# Patient Record
Sex: Male | Born: 1990 | Race: White | Hispanic: No | Marital: Married | Smoking: Never smoker
Health system: Southern US, Community
[De-identification: ages and names within clinical notes are randomized; demographics above are authoritative.]

## PROBLEM LIST (undated history)

## (undated) DIAGNOSIS — J45909 Unspecified asthma, uncomplicated: Secondary | ICD-10-CM

## (undated) DIAGNOSIS — M419 Scoliosis, unspecified: Secondary | ICD-10-CM

## (undated) DIAGNOSIS — N200 Calculus of kidney: Secondary | ICD-10-CM

## (undated) DIAGNOSIS — F419 Anxiety disorder, unspecified: Secondary | ICD-10-CM

## (undated) HISTORY — DX: Calculus of kidney: N20.0

## (undated) HISTORY — PX: KNEE ARTHROSCOPY: SHX127

## (undated) HISTORY — DX: Anxiety disorder, unspecified: F41.9

## (undated) HISTORY — DX: Unspecified asthma, uncomplicated: J45.909

## (undated) HISTORY — PX: OTHER SURGICAL HISTORY: SHX169

## (undated) HISTORY — DX: Scoliosis, unspecified: M41.9

---

## 2015-04-19 ENCOUNTER — Ambulatory Visit (INDEPENDENT_AMBULATORY_CARE_PROVIDER_SITE_OTHER): Payer: 59 | Admitting: Adult Health

## 2015-04-19 ENCOUNTER — Encounter: Payer: Self-pay | Admitting: Adult Health

## 2015-04-19 VITALS — BP 110/84 | Temp 98.1°F | Ht 71.0 in | Wt 256.2 lb

## 2015-04-19 DIAGNOSIS — M419 Scoliosis, unspecified: Secondary | ICD-10-CM | POA: Diagnosis not present

## 2015-04-19 DIAGNOSIS — Z0000 Encounter for general adult medical examination without abnormal findings: Secondary | ICD-10-CM

## 2015-04-19 DIAGNOSIS — N529 Male erectile dysfunction, unspecified: Secondary | ICD-10-CM | POA: Diagnosis not present

## 2015-04-19 DIAGNOSIS — J4520 Mild intermittent asthma, uncomplicated: Secondary | ICD-10-CM | POA: Diagnosis not present

## 2015-04-19 LAB — POCT URINALYSIS DIPSTICK
Bilirubin, UA: NEGATIVE
GLUCOSE UA: NEGATIVE
Ketones, UA: NEGATIVE
Leukocytes, UA: NEGATIVE
NITRITE UA: NEGATIVE
Protein, UA: NEGATIVE
RBC UA: NEGATIVE
Urobilinogen, UA: 0.2
pH, UA: 6.5

## 2015-04-19 LAB — BASIC METABOLIC PANEL
BUN: 10 mg/dL (ref 6–23)
CHLORIDE: 102 meq/L (ref 96–112)
CO2: 30 meq/L (ref 19–32)
CREATININE: 0.89 mg/dL (ref 0.40–1.50)
Calcium: 9.8 mg/dL (ref 8.4–10.5)
GFR: 112.6 mL/min (ref >60.00)
Glucose, Bld: 103 mg/dL — ABNORMAL HIGH (ref 70–99)
Potassium: 4.2 mEq/L (ref 3.5–5.1)
Sodium: 141 mEq/L (ref 135–145)

## 2015-04-19 LAB — CBC WITH DIFFERENTIAL/PLATELET
BASOS ABS: 0 10*3/uL (ref 0.0–0.1)
Basophils Relative: 0.5 % (ref 0.0–3.0)
EOS ABS: 0.2 10*3/uL (ref 0.0–0.7)
Eosinophils Relative: 2.2 % (ref 0.0–5.0)
HCT: 47.1 % (ref 39.0–52.0)
HEMOGLOBIN: 15.9 g/dL (ref 13.0–17.0)
LYMPHS ABS: 1.9 10*3/uL (ref 0.7–4.0)
Lymphocytes Relative: 28.1 % (ref 12.0–46.0)
MCHC: 33.9 g/dL (ref 30.0–36.0)
MCV: 85.4 fl (ref 78.0–100.0)
Monocytes Absolute: 0.7 10*3/uL (ref 0.1–1.0)
Monocytes Relative: 9.7 % (ref 3.0–12.0)
NEUTROS ABS: 4.1 10*3/uL (ref 1.4–7.7)
NEUTROS PCT: 59.5 % (ref 43.0–77.0)
Platelets: 352 10*3/uL (ref 150.0–400.0)
RBC: 5.51 Mil/uL (ref 4.22–5.81)
RDW: 13.8 % (ref 11.5–15.5)
WBC: 6.8 10*3/uL (ref 4.0–10.5)

## 2015-04-19 LAB — TSH: TSH: 1.13 u[IU]/mL (ref 0.35–4.50)

## 2015-04-19 LAB — LIPID PANEL
CHOL/HDL RATIO: 5
Cholesterol: 147 mg/dL (ref 0–200)
HDL: 30.5 mg/dL — AB (ref >39.00)
LDL Cholesterol: 94 mg/dL (ref 0–99)
NonHDL: 116.5
TRIGLYCERIDES: 111 mg/dL (ref 0.0–149.0)
VLDL: 22.2 mg/dL (ref 0.0–40.0)

## 2015-04-19 LAB — HEPATIC FUNCTION PANEL
ALBUMIN: 4.4 g/dL (ref 3.5–5.2)
ALK PHOS: 78 U/L (ref 39–117)
ALT: 23 U/L (ref 0–53)
AST: 15 U/L (ref 0–37)
BILIRUBIN DIRECT: 0.1 mg/dL (ref 0.0–0.3)
TOTAL PROTEIN: 7.7 g/dL (ref 6.0–8.3)
Total Bilirubin: 0.6 mg/dL (ref 0.2–1.2)

## 2015-04-19 LAB — HEMOGLOBIN A1C: Hgb A1c MFr Bld: 5.5 % (ref 4.6–6.5)

## 2015-04-19 MED ORDER — ALBUTEROL SULFATE 108 (90 BASE) MCG/ACT IN AEPB
108.0000 ug | INHALATION_SPRAY | Freq: Two times a day (BID) | RESPIRATORY_TRACT | Status: DC | PRN
Start: 2015-04-19 — End: 2017-01-22

## 2015-04-19 MED ORDER — MELOXICAM 15 MG PO TABS: 15.0000 mg | ORAL_TABLET | Freq: Every day | ORAL | Status: DC
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
## 2015-04-19 NOTE — Patient Instructions (Addendum)
It was great meeting you today!   I have sent prescriptions in to the pharmacy for Mobic, which you will take for your back pain as well as the Pro Air inhaler.   I will follow up with you regarding your blood work.   Continue to exercise and eat healthy.   Health Maintenance A healthy lifestyle and preventative care can promote health and wellness.  Maintain regular health, dental, and eye exams.  Eat a healthy diet. Foods like vegetables, fruits, whole grains, low-fat dairy products, and lean protein foods contain the nutrients you need and are low in calories. Decrease your intake of foods high in solid fats, added sugars, and salt. Get information about a proper diet from your health care provider, if necessary.  Regular physical exercise is one of the most important things you can do for your health. Most adults should get at least 150 minutes of moderate-intensity exercise (any activity that increases your heart rate and causes you to sweat) each week. In addition, most adults need muscle-strengthening exercises on 2 or more days a week.   Maintain a healthy weight. The body mass index (BMI) is a screening tool to identify possible weight problems. It provides an estimate of body fat based on height and weight. Your health care provider can find your BMI and can help you achieve or maintain a healthy weight. For males 20 years and older:  A BMI below 18.5 is considered underweight.  A BMI of 18.5 to 24.9 is normal.  A BMI of 25 to 29.9 is considered overweight.  A BMI of 30 and above is considered obese.  Maintain normal blood lipids and cholesterol by exercising and minimizing your intake of saturated fat. Eat a balanced diet with plenty of fruits and vegetables. Blood tests for lipids and cholesterol should begin at age 23 and be repeated every 5 years. If your lipid or cholesterol levels are high, you are over age 23, or you are at high risk for heart disease, you may need your  cholesterol levels checked more frequently.Ongoing high lipid and cholesterol levels should be treated with medicines if diet and exercise are not working.  If you smoke, find out from your health care provider how to quit. If you do not use tobacco, do not start.  Lung cancer screening is recommended for adults aged 55-80 years who are at high risk for developing lung cancer because of a history of smoking. A yearly low-dose CT scan of the lungs is recommended for people who have at least a 30-pack-year history of smoking and are current smokers or have quit within the past 15 years. A pack year of smoking is smoking an average of 1 pack of cigarettes a day for 1 year (for example, a 30-pack-year history of smoking could mean smoking 1 pack a day for 30 years or 2 packs a day for 15 years). Yearly screening should continue until the smoker has stopped smoking for at least 15 years. Yearly screening should be stopped for people who develop a health problem that would prevent them from having lung cancer treatment.  If you choose to drink alcohol, do not have more than 2 drinks per day. One drink is considered to be 12 oz (360 mL) of beer, 5 oz (150 mL) of wine, or 1.5 oz (45 mL) of liquor.  Avoid the use of street drugs. Do not share needles with anyone. Ask for help if you need support or instructions about stopping the use  of drugs.  High blood pressure causes heart disease and increases the risk of stroke. Blood pressure should be checked at least every 1-2 years. Ongoing high blood pressure should be treated with medicines if weight loss and exercise are not effective.  If you are 10-26 years old, ask your health care provider if you should take aspirin to prevent heart disease.  Diabetes screening involves taking a blood sample to check your fasting blood sugar level. This should be done once every 3 years after age 34 if you are at a normal weight and without risk factors for diabetes. Testing  should be considered at a younger age or be carried out more frequently if you are overweight and have at least 1 risk factor for diabetes.  Colorectal cancer can be detected and often prevented. Most routine colorectal cancer screening begins at the age of 34 and continues through age 41. However, your health care provider may recommend screening at an earlier age if you have risk factors for colon cancer. On a yearly basis, your health care provider may provide home test kits to check for hidden blood in the stool. A small camera at the end of a tube may be used to directly examine the colon (sigmoidoscopy or colonoscopy) to detect the earliest forms of colorectal cancer. Talk to your health care provider about this at age 28 when routine screening begins. A direct exam of the colon should be repeated every 5-10 years through age 74, unless early forms of precancerous polyps or small growths are found.  People who are at an increased risk for hepatitis B should be screened for this virus. You are considered at high risk for hepatitis B if:  You were born in a country where hepatitis B occurs often. Talk with your health care provider about which countries are considered high risk.  Your parents were born in a high-risk country and you have not received a shot to protect against hepatitis B (hepatitis B vaccine).  You have HIV or AIDS.  You use needles to inject street drugs.  You live with, or have sex with, someone who has hepatitis B.  You are a man who has sex with other men (MSM).  You get hemodialysis treatment.  You take certain medicines for conditions like cancer, organ transplantation, and autoimmune conditions.  Hepatitis C blood testing is recommended for all people born from [birth year] through 1964 and any individual with known risk factors for hepatitis C.  Healthy men should no longer receive prostate-specific antigen (PSA) blood tests as part of routine cancer screening. Talk to  your health care provider about prostate cancer screening.  Testicular cancer screening is not recommended for adolescents or adult males who have no symptoms. Screening includes self-exam, a health care provider exam, and other screening tests. Consult with your health care provider about any symptoms you have or any concerns you have about testicular cancer.  Practice safe sex. Use condoms and avoid high-risk sexual practices to reduce the spread of sexually transmitted infections (STIs).  You should be screened for STIs, including gonorrhea and chlamydia if:  You are sexually active and are younger than 24 years.  You are older than 24 years, and your health care provider tells you that you are at risk for this type of infection.  Your sexual activity has changed since you were last screened, and you are at an increased risk for chlamydia or gonorrhea. Ask your health care provider if you are at risk.  If you are at risk of being infected with HIV, it is recommended that you take a prescription medicine daily to prevent HIV infection. This is called pre-exposure prophylaxis (PrEP). You are considered at risk if:  You are a man who has sex with other men (MSM).  You are a heterosexual man who is sexually active with multiple partners.  You take drugs by injection.  You are sexually active with a partner who has HIV.  Talk with your health care provider about whether you are at high risk of being infected with HIV. If you choose to begin PrEP, you should first be tested for HIV. You should then be tested every 3 months for as long as you are taking PrEP.  Use sunscreen. Apply sunscreen liberally and repeatedly throughout the day. You should seek shade when your shadow is shorter than you. Protect yourself by wearing long sleeves, pants, a wide-brimmed hat, and sunglasses year round whenever you are outdoors.  Tell your health care provider of new moles or changes in moles, especially if  there is a change in shape or color. Also, tell your health care provider if a mole is larger than the size of a pencil eraser.  A one-time screening for abdominal aortic aneurysm (AAA) and surgical repair of large AAAs by ultrasound is recommended for men aged 75-75 years who are current or former smokers.  Stay current with your vaccines (immunizations). Document Released: 03/21/2008 Document Revised: 09/28/2013 Document Reviewed: 02/18/2011 [facility] Patient Information 2015 [place], [place]. This information is not intended to replace advice given to you by your health care provider. Make sure you discuss any questions you have with your health care provider.

## 2015-04-20 LAB — TESTOSTERONE, FREE, TOTAL, SHBG
SEX HORMONE BINDING: 17 nmol/L (ref 10–50)
TESTOSTERONE: 222 ng/dL — AB (ref 300–890)
Testosterone, Free: 59.1 pg/mL (ref 47.0–244.0)
Testosterone-% Free: 2.7 % (ref 1.6–2.9)

## 2015-04-21 ENCOUNTER — Telehealth: Payer: Self-pay | Admitting: Adult Health

## 2015-04-21 NOTE — Telephone Encounter (Signed)
Spoke to patient and informed him on his labs. His testosterone was slightly low. Explained that this is likely a result of his recent weight gain and that losing the weight will help.

## 2015-07-04 ENCOUNTER — Encounter (HOSPITAL_COMMUNITY): Payer: Self-pay | Admitting: *Deleted

## 2015-07-04 ENCOUNTER — Emergency Department (INDEPENDENT_AMBULATORY_CARE_PROVIDER_SITE_OTHER)
Admission: EM | Admit: 2015-07-04 | Discharge: 2015-07-04 | Disposition: A | Discharge status: Home / Self Care | Payer: 59 | Source: Home / Self Care | Attending: Family Medicine | Admitting: Family Medicine

## 2015-07-04 DIAGNOSIS — R51 Headache: Secondary | ICD-10-CM | POA: Diagnosis not present

## 2015-07-04 DIAGNOSIS — R519 Headache, unspecified: Secondary | ICD-10-CM

## 2015-07-04 DIAGNOSIS — S161XXA Strain of muscle, fascia and tendon at neck level, initial encounter: Secondary | ICD-10-CM | POA: Diagnosis not present

## 2015-07-04 MED ORDER — BUTALBITAL-ASPIRIN-CAFFEINE 50-325-40 MG PO CAPS: 1.0000 | ORAL_CAPSULE | Freq: Four times a day (QID) | ORAL | Status: DC | PRN

## 2015-07-04 NOTE — Discharge Instructions (Signed)
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
## 2015-11-05 ENCOUNTER — Emergency Department (HOSPITAL_COMMUNITY): Payer: 59

## 2015-11-05 ENCOUNTER — Emergency Department (HOSPITAL_COMMUNITY)
Admission: EM | Admit: 2015-11-05 | Discharge: 2015-11-06 | Disposition: A | Discharge status: Home / Self Care | Payer: 59 | Attending: Emergency Medicine | Admitting: Emergency Medicine

## 2015-11-05 ENCOUNTER — Encounter (HOSPITAL_COMMUNITY): Payer: Self-pay

## 2015-11-05 DIAGNOSIS — R112 Nausea with vomiting, unspecified: Secondary | ICD-10-CM | POA: Insufficient documentation

## 2015-11-05 DIAGNOSIS — K5900 Constipation, unspecified: Secondary | ICD-10-CM | POA: Insufficient documentation

## 2015-11-05 DIAGNOSIS — M419 Scoliosis, unspecified: Secondary | ICD-10-CM | POA: Insufficient documentation

## 2015-11-05 DIAGNOSIS — N200 Calculus of kidney: Secondary | ICD-10-CM | POA: Insufficient documentation

## 2015-11-05 DIAGNOSIS — Z79899 Other long term (current) drug therapy: Secondary | ICD-10-CM | POA: Diagnosis not present

## 2015-11-05 DIAGNOSIS — J45909 Unspecified asthma, uncomplicated: Secondary | ICD-10-CM | POA: Diagnosis not present

## 2015-11-05 DIAGNOSIS — N50819 Testicular pain, unspecified: Secondary | ICD-10-CM

## 2015-11-05 DIAGNOSIS — N50812 Left testicular pain: Secondary | ICD-10-CM | POA: Diagnosis present

## 2015-11-05 LAB — CBC WITH DIFFERENTIAL/PLATELET
BASOS ABS: 0 10*3/uL (ref 0.0–0.1)
BASOS PCT: 0 %
EOS PCT: 1 %
Eosinophils Absolute: 0.2 10*3/uL (ref 0.0–0.7)
HCT: 46.2 % (ref 39.0–52.0)
Hemoglobin: 15.7 g/dL (ref 13.0–17.0)
Lymphocytes Relative: 31 %
Lymphs Abs: 3.6 10*3/uL (ref 0.7–4.0)
MCH: 29 pg (ref 26.0–34.0)
MCHC: 34 g/dL (ref 30.0–36.0)
MCV: 85.2 fL (ref 78.0–100.0)
MONO ABS: 1.1 10*3/uL — AB (ref 0.1–1.0)
Monocytes Relative: 9 %
Neutro Abs: 6.8 10*3/uL (ref 1.7–7.7)
Neutrophils Relative %: 58 %
PLATELETS: 344 10*3/uL (ref 150–400)
RBC: 5.42 MIL/uL (ref 4.22–5.81)
RDW: 12.9 % (ref 11.5–15.5)
WBC: 11.6 10*3/uL — ABNORMAL HIGH (ref 4.0–10.5)

## 2015-11-05 MED ORDER — FENTANYL CITRATE (PF) 100 MCG/2ML IJ SOLN
50.0000 ug | Freq: Once | INTRAMUSCULAR | Status: AC
  Administered 2015-11-05: 50 ug via INTRAVENOUS
  Filled 2015-11-05: qty 2

## 2015-11-05 NOTE — ED Notes (Signed)
Pt c/o L testicular pain with sudden onset this afternoon. Pt appears uncomfortable lying in bed, unable to get comfortable. Hx of kidney stones, reports current pain is similar in nature, "but it has never radiated into my scrotum like this." Pt has been unable to urinate since pain started.

## 2015-11-05 NOTE — ED Provider Notes (Signed)
CSN: [id number]     Arrival date & time 11/05/15  2219 History  By signing my name below, I, [name], attest that this documentation has been prepared under the direction and in the presence of Shon Baton, MD. Electronically Signed: [name], ED Scribe. 11/05/2015. 11:42 PM    Chief Complaint  Patient presents with  . Testicle Pain   The history is provided by the patient. No language interpreter was used.   Frank Pitts is a 25 y.o. male with history of kidney stones who presents to the Emergency Department complaining of new, atraumatic, 10/10 in severity, left-sided testicular pain with sudden onset 3 hours ago. He has not had testicular pain with previous kidney stones. Fentanyl provided some relief in the ED. Associated symptoms include left testicle swelling, nausea, vomiting, abdominal pain, constipation, and difficulty urinating ("I can't pee"). Pt denies fever, redness at the testicle, and back pain(which he had with previous kidney stones).   Past Medical History  Diagnosis Date  . Asthma   . Kidney stones   . Scoliosis    Past Surgical History  Procedure Laterality Date  . Knee arthroscopy  2014 and 2015  . Fractured femur    . Rod removal from prior surgery      left femur    Family History  Problem Relation Age of Onset  . Hypertension      paternal side   . Diabetes      paternal side   . Cancer Maternal Grandfather     Colon Cancer   Social History  Substance Use Topics  . Smoking status: Never Smoker   . Smokeless tobacco: None  . Alcohol Use: 0.0 oz/week    0 Standard drinks or equivalent per week     Comment: per pt rare;     Review of Systems  Constitutional: Negative for fever.  Gastrointestinal: Positive for nausea, vomiting and constipation.  Genitourinary: Positive for difficulty urinating and testicular pain (and swelling).  Musculoskeletal: Negative for back pain.  Skin: Negative for color change.  All other systems  reviewed and are negative.  Allergies  Review of patient's allergies indicates no known allergies.  Home Medications   Prior to Admission medications   Medication Sig Start Date End Date Taking? Authorizing Provider  ibuprofen (ADVIL,MOTRIN) 800 MG tablet Take 800 mg by mouth every 8 (eight) hours as needed for moderate pain.   Yes Historical Provider, MD  Multiple Vitamin (MULTIVITAMIN) tablet Take 1 tablet by mouth daily.   Yes Historical Provider, MD  Albuterol Sulfate (PROAIR RESPICLICK) 108 (90 BASE) MCG/ACT AEPB Inhale 108 mcg into the lungs 2 (two) times daily as needed. 04/19/15   [name], NP  butalbital-aspirin-caffeine [facility]) [id number] MG capsule Take 1 capsule by mouth every 6 (six) hours as needed for headache. Patient not taking: Reported on 11/06/2015 07/04/15   Elvina Sidle, MD  ondansetron (ZOFRAN ODT) 4 MG disintegrating tablet Take 1 tablet (4 mg total) by mouth every 8 (eight) hours as needed for nausea or vomiting. 11/06/15   Shon Baton, MD  oxyCODONE-acetaminophen (PERCOCET/ROXICET) 5-325 MG tablet Take 1 tablet by mouth every 6 (six) hours as needed for severe pain. 11/06/15   Shon Baton, MD   BP 123/90 mmHg  Pulse 80  Temp(Src) 97.6 F (36.4 C) (Oral)  Resp 16  Ht  (1.803 m)  Wt 258 lb 8 oz (117.255 kg)  BMI 36.07 kg/m2  SpO2 95% Physical Exam  Constitutional: He is  oriented to person, place, and time. He appears well-developed and well-nourished. No distress.  HENT:  Head: Normocephalic and atraumatic.  Cardiovascular: Normal rate, regular rhythm and normal heart sounds.   No murmur heard. Pulmonary/Chest: Effort normal and breath sounds normal. No respiratory distress. He has no wheezes.  Abdominal: Soft. Bowel sounds are normal. There is no tenderness. There is no rebound and no guarding.  Genitourinary:  Normal circumcised penis, no redness or erythema to the left testicle, no obvious swelling, normal cremasteric reflex   Musculoskeletal: He exhibits no edema.  Neurological: He is alert and oriented to person, place, and time.  Skin: Skin is warm and dry.  Psychiatric: He has a normal mood and affect.  Nursing note and vitals reviewed.   ED Course  Procedures (including critical care time) DIAGNOSTIC STUDIES: Oxygen Saturation is 95% on RA,  normal by my interpretation.    COORDINATION OF CARE: 11:23 PM Discussed treatment plan which includes lab work, renal US,scrotal US, and art/ven flow A/P [place] with pt at bedside and pt agreed to plan.  Labs Review Labs Reviewed  URINALYSIS, ROUTINE W REFLEX MICROSCOPIC (NOT AT [facility]) - Abnormal; Notable for the following:    APPearance CLOUDY (*)    Hgb urine dipstick LARGE (*)    Ketones, ur 15 (*)    All other components within normal limits  CBC WITH DIFFERENTIAL/PLATELET - Abnormal; Notable for the following:    WBC 11.6 (*)    Monocytes Absolute 1.1 (*)    All other components within normal limits  BASIC METABOLIC PANEL - Abnormal; Notable for the following:    Glucose, Bld 125 (*)    Anion gap 17 (*)    All other components within normal limits  URINE MICROSCOPIC-ADD ON - Abnormal; Notable for the following:    Squamous Epithelial / LPF 0-5 (*)    Bacteria, UA RARE (*)    Casts HYALINE CASTS (*)    All other components within normal limits    Imaging Review US Scrotum  11/06/2015  CLINICAL DATA:  Left testicular pain for 3 hours EXAM: SCROTAL ULTRASOUND DOPPLER ULTRASOUND OF THE TESTICLES TECHNIQUE: Complete ultrasound examination of the testicles, epididymis, and other scrotal structures was performed. Color and spectral Doppler ultrasound were also utilized to evaluate blood flow to the testicles. COMPARISON:  None. FINDINGS: Right testicle Measurements: 49 x 21 x 27 mm. No mass or microlithiasis visualized. Left testicle Measurements: 47 x 25 x 34 mm. No mass or microlithiasis visualized. Right epididymis: Simple appearing incidental 7 mm cyst.  Normal size and vascularity Left epididymis:  Normal in size and appearance. Hydrocele:  Small volume, potentially physiologic fluid bilaterally. Varicocele:  None visualized. Pulsed Doppler interrogation of both testes demonstrates normal low resistance arterial and venous waveforms bilaterally. IMPRESSION: Normal appearance of the testicles.  No explanation for acute pain. Electronically Signed   By: Marnee Spring M.D.   On: 11/06/2015 00:28   US Renal  11/06/2015  CLINICAL DATA:  Abdominal pain for 3 hours. History of kidney stones. EXAM: RENAL / URINARY TRACT ULTRASOUND COMPLETE COMPARISON:  None. FINDINGS: Right Kidney: Length: 10.8 cm. Echogenicity within normal limits. No mass or hydronephrosis visualized. No shadowing nephrolithiasis. Left Kidney: Length: 11.6 cm. Echogenicity within normal limits. No mass or hydronephrosis visualized. No shadowing nephrolithiasis. Bladder: Appears normal for degree of bladder distention. IMPRESSION: Normal renal ultrasound.  No hydronephrosis. Electronically Signed   By: Rubye Oaks M.D.   On: 11/06/2015 00:38   [place] Art/ven Flow  Abd Pelv Doppler  11/06/2015  CLINICAL DATA:  Left testicular pain for 3 hours EXAM: SCROTAL ULTRASOUND DOPPLER ULTRASOUND OF THE TESTICLES TECHNIQUE: Complete ultrasound examination of the testicles, epididymis, and other scrotal structures was performed. Color and spectral Doppler ultrasound were also utilized to evaluate blood flow to the testicles. COMPARISON:  None. FINDINGS: Right testicle Measurements: 49 x 21 x 27 mm. No mass or microlithiasis visualized. Left testicle Measurements: 47 x 25 x 34 mm. No mass or microlithiasis visualized. Right epididymis: Simple appearing incidental 7 mm cyst. Normal size and vascularity Left epididymis:  Normal in size and appearance. Hydrocele:  Small volume, potentially physiologic fluid bilaterally. Varicocele:  None visualized. Pulsed Doppler interrogation of both testes demonstrates normal  low resistance arterial and venous waveforms bilaterally. IMPRESSION: Normal appearance of the testicles.  No explanation for acute pain. Electronically Signed   By: Marnee Spring M.D.   On: 11/06/2015 00:28   I have personally reviewed and evaluated these images and lab results as part of my medical decision-making.   EKG Interpretation None      MDM   Final diagnoses:  Kidney stone   Patient presents with left testicular pain. It is sharp in nature. He is nontoxic on exam. Physical exam is benign and normal. Patient does have a history of kidney stones but states that it has never gone into his testicle. Clinically his testicular exam is normal.  Patient was given pain and nausea medication. Patient was given fluids. Ultrasound of the testicle and renal systems are normal. He does have too numerous to count red cells in his urine. I presume that this is a kidney stone. On recheck, he reports improvement of his symptoms. He was given instructions regarding continued supportive care at home and will be discharged with urology follow-up.  After history, exam, and medical workup I feel the patient has been appropriately medically screened and is safe for discharge home. Pertinent diagnoses were discussed with the patient. Patient was given return precautions.  I personally performed the services described in this documentation, which was scribed in my presence. The recorded information has been reviewed and is accurate.    Shon Baton, MD 11/06/15 [phone]

## 2015-11-05 NOTE — ED Notes (Signed)
Pt reports onset 1-2 hours ago left testicular pain.  Pt not sure if there is any swelling.

## 2015-11-05 NOTE — ED Notes (Signed)
Pt taken to US

## 2015-11-06 DIAGNOSIS — N200 Calculus of kidney: Secondary | ICD-10-CM | POA: Diagnosis not present

## 2015-11-06 DIAGNOSIS — M419 Scoliosis, unspecified: Secondary | ICD-10-CM | POA: Diagnosis not present

## 2015-11-06 DIAGNOSIS — K5900 Constipation, unspecified: Secondary | ICD-10-CM | POA: Diagnosis not present

## 2015-11-06 DIAGNOSIS — J45909 Unspecified asthma, uncomplicated: Secondary | ICD-10-CM | POA: Diagnosis not present

## 2015-11-06 DIAGNOSIS — N50812 Left testicular pain: Secondary | ICD-10-CM | POA: Diagnosis not present

## 2015-11-06 DIAGNOSIS — Z79899 Other long term (current) drug therapy: Secondary | ICD-10-CM | POA: Diagnosis not present

## 2015-11-06 DIAGNOSIS — R109 Unspecified abdominal pain: Secondary | ICD-10-CM | POA: Diagnosis not present

## 2015-11-06 DIAGNOSIS — R112 Nausea with vomiting, unspecified: Secondary | ICD-10-CM | POA: Diagnosis not present

## 2015-11-06 LAB — BASIC METABOLIC PANEL
ANION GAP: 17 — AB (ref 5–15)
BUN: 11 mg/dL (ref 6–20)
CALCIUM: 9.9 mg/dL (ref 8.9–10.3)
CO2: 23 mmol/L (ref 22–32)
Chloride: 101 mmol/L (ref 101–111)
Creatinine, Ser: 1.21 mg/dL (ref 0.61–1.24)
Glucose, Bld: 125 mg/dL — ABNORMAL HIGH (ref 65–99)
Potassium: 4 mmol/L (ref 3.5–5.1)
SODIUM: 141 mmol/L (ref 135–145)

## 2015-11-06 LAB — URINALYSIS, ROUTINE W REFLEX MICROSCOPIC
Bilirubin Urine: NEGATIVE
Glucose, UA: NEGATIVE mg/dL
KETONES UR: 15 mg/dL — AB
LEUKOCYTES UA: NEGATIVE
NITRITE: NEGATIVE
PROTEIN: NEGATIVE mg/dL
Specific Gravity, Urine: 1.021 (ref 1.005–1.030)
pH: 5 (ref 5.0–8.0)

## 2015-11-06 LAB — URINE MICROSCOPIC-ADD ON

## 2015-11-06 MED ORDER — KETOROLAC TROMETHAMINE 30 MG/ML IJ SOLN
30.0000 mg | Freq: Once | INTRAMUSCULAR | Status: AC
  Administered 2015-11-06: 30 mg via INTRAVENOUS
  Filled 2015-11-06: qty 1

## 2015-11-06 MED ORDER — ONDANSETRON HCL 4 MG/2ML IJ SOLN
4.0000 mg | Freq: Once | INTRAMUSCULAR | Status: AC
  Administered 2015-11-06: 4 mg via INTRAVENOUS
  Filled 2015-11-06: qty 2

## 2015-11-06 MED ORDER — SODIUM CHLORIDE 0.9 % IV BOLUS (SEPSIS)
1000.0000 mL | Freq: Once | INTRAVENOUS | Status: AC
Start: 2015-11-06 — End: 2015-11-06
  Administered 2015-11-06: 1000 mL via INTRAVENOUS

## 2015-11-06 MED ORDER — OXYCODONE-ACETAMINOPHEN 5-325 MG PO TABS
1.0000 | ORAL_TABLET | Freq: Once | ORAL | Status: AC
  Administered 2015-11-06: 1 via ORAL
  Filled 2015-11-06: qty 1

## 2015-11-06 MED ORDER — OXYCODONE-ACETAMINOPHEN 5-325 MG PO TABS: 1.0000 | ORAL_TABLET | Freq: Four times a day (QID) | ORAL | Status: DC | PRN

## 2015-11-06 MED ORDER — ONDANSETRON 4 MG PO TBDP: 4.0000 mg | ORAL_TABLET | Freq: Three times a day (TID) | ORAL | Status: DC | PRN

## 2015-11-06 MED ORDER — MORPHINE SULFATE (PF) 4 MG/ML IV SOLN
4.0000 mg | Freq: Once | INTRAVENOUS | Status: AC
  Administered 2015-11-06: 4 mg via INTRAVENOUS
  Filled 2015-11-06: qty 1

## 2015-11-06 MED FILL — ONDANSETRON ODT 4 MG TABLET: 4 | 7 days supply | Qty: 20 | Fill #0

## 2015-11-06 MED FILL — OXYCODONE/APAP 5-325: 5-325 | 4 days supply | Qty: 15 | Fill #0

## 2015-11-06 NOTE — Discharge Instructions (Signed)
Kidney Stones °Kidney stones (urolithiasis) are deposits that form inside your kidneys. The intense pain is caused by the stone moving through the urinary tract. When the stone moves, the ureter goes into spasm around the stone. The stone is usually passed in the urine.  °CAUSES  °· A disorder that makes certain neck glands produce too much parathyroid hormone (primary hyperparathyroidism). °· A buildup of uric acid crystals, similar to gout in your joints. °· Narrowing (stricture) of the ureter. °· A kidney obstruction present at birth (congenital obstruction). °· Previous surgery on the kidney or ureters. °· Numerous kidney infections. °SYMPTOMS  °· Feeling sick to your stomach (nauseous). °· Throwing up (vomiting). °· Blood in the urine (hematuria). °· Pain that usually spreads (radiates) to the groin. °· Frequency or urgency of urination. °DIAGNOSIS  °· Taking a history and physical exam. °· Blood or urine tests. °· CT scan. °· Occasionally, an examination of the inside of the urinary bladder (cystoscopy) is performed. °TREATMENT  °· Observation. °· Increasing your fluid intake. °· Extracorporeal shock wave lithotripsy--This is a noninvasive procedure that uses shock waves to break up kidney stones. °· Surgery may be needed if you have severe pain or persistent obstruction. There are various surgical procedures. Most of the procedures are performed with the use of small instruments. Only small incisions are needed to accommodate these instruments, so recovery time is minimized. °The size, location, and chemical composition are all important variables that will determine the proper choice of action for you. Talk to your health care provider to better understand your situation so that you will minimize the risk of injury to yourself and your kidney.  °HOME CARE INSTRUCTIONS  °· Drink enough water and fluids to keep your urine clear or pale yellow. This will help you to pass the stone or stone fragments. °· Strain  all urine through the provided strainer. Keep all particulate matter and stones for your health care provider to see. The stone causing the pain may be as small as a grain of salt. It is very important to use the strainer each and every time you pass your urine. The collection of your stone will allow your health care provider to analyze it and verify that a stone has actually passed. The stone analysis will often identify what you can do to reduce the incidence of recurrences. °· Only take over-the-counter or prescription medicines for pain, discomfort, or fever as directed by your health care provider. °· Keep all follow-up visits as told by your health care provider. This is important. °· Get follow-up X-rays if required. The absence of pain does not always mean that the stone has passed. It may have only stopped moving. If the urine remains completely obstructed, it can cause loss of kidney function or even complete destruction of the kidney. It is your responsibility to make sure X-rays and follow-ups are completed. Ultrasounds of the kidney can show blockages and the status of the kidney. Ultrasounds are not associated with any radiation and can be performed easily in a matter of minutes. °· Make changes to your daily diet as told by your health care provider. You may be told to: °¨ Limit the amount of salt that you eat. °¨ Eat 5 or more servings of fruits and vegetables each day. °¨ Limit the amount of meat, poultry, fish, and eggs that you eat. °· Collect a 24-hour urine sample as told by your health care provider. You may need to collect another urine sample every 6-12   months. °SEEK MEDICAL CARE IF: °· You experience pain that is progressive and unresponsive to any pain medicine you have been prescribed. °SEEK IMMEDIATE MEDICAL CARE IF:  °· Pain cannot be controlled with the prescribed medicine. °· You have a fever or shaking chills. °· The severity or intensity of pain increases over 18 hours and is not  relieved by pain medicine. °· You develop a new onset of abdominal pain. °· You feel faint or pass out. °· You are unable to urinate. °  °This information is not intended to replace advice given to you by your health care provider. Make sure you discuss any questions you have with your health care provider. °  °Document Released: 09/23/2005 Document Revised: 06/14/2015 Document Reviewed: 02/24/2013 °Elsevier Interactive Patient Education ©2016 [employer] ° °

## 2016-02-04 IMAGING — US US RENAL
1 series · 14 of 22 positions shown · non-contrast
Comparison: None.

CLINICAL DATA: Abdominal pain for 3 hours. History of kidney
stones.

EXAM:
RENAL / URINARY TRACT ULTRASOUND COMPLETE

[Series 1: us renal · 0.23mm/px · 14 of 22 slices shown]
[im 1/22]
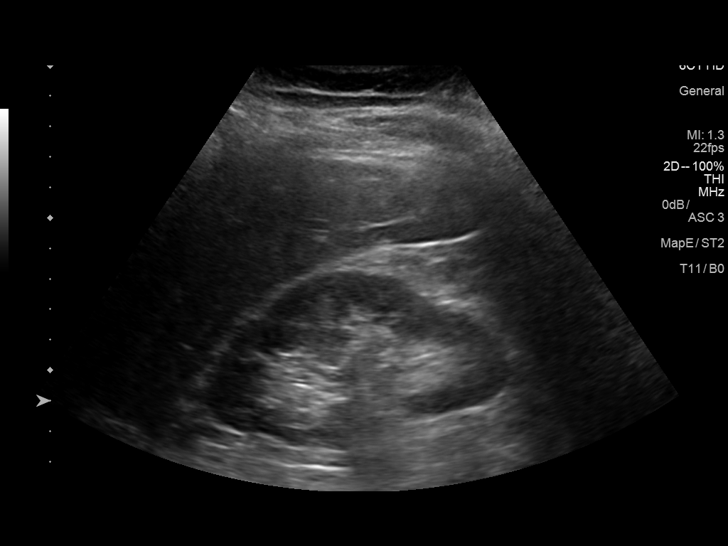
[im 3/22]
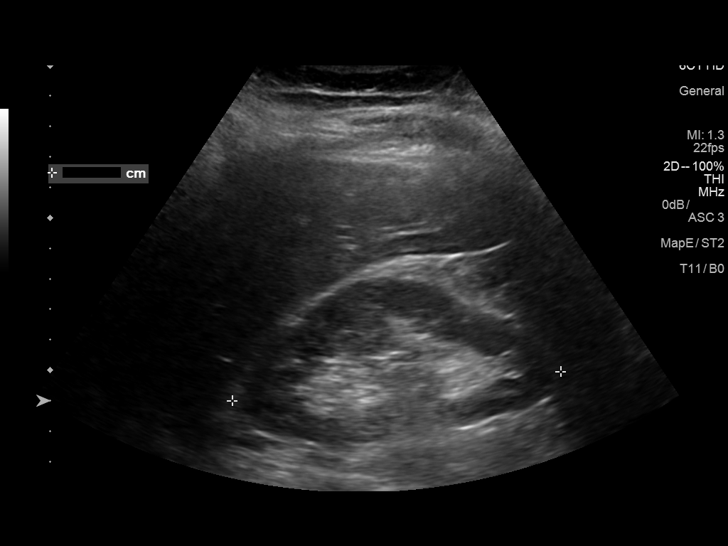
[im 4/22]
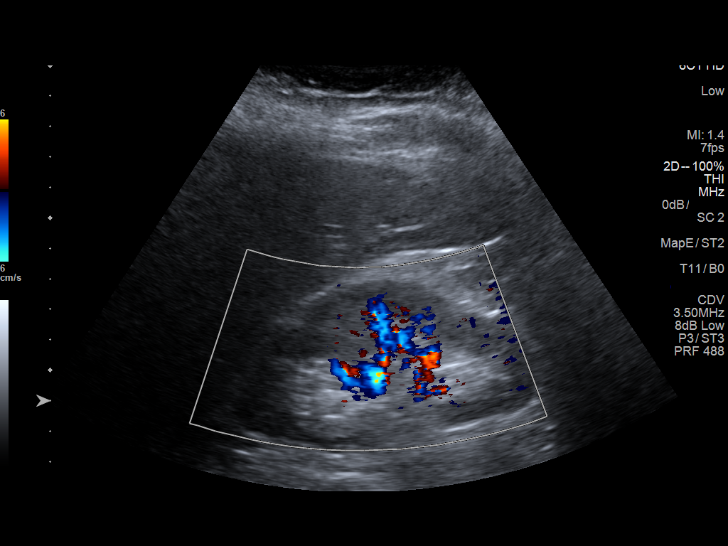
[im 6/22]
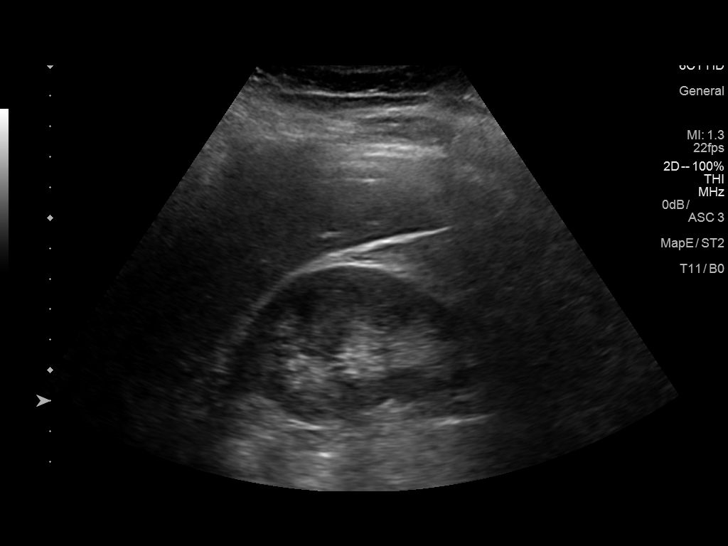
[im 8/22]
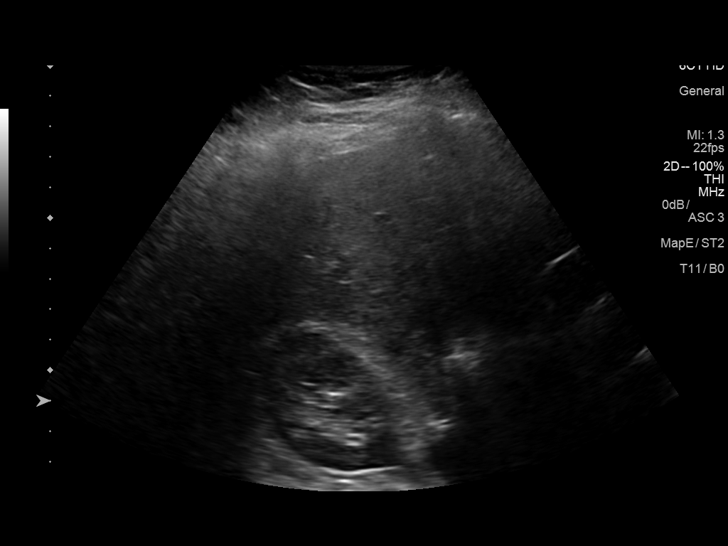
[im 9/22]
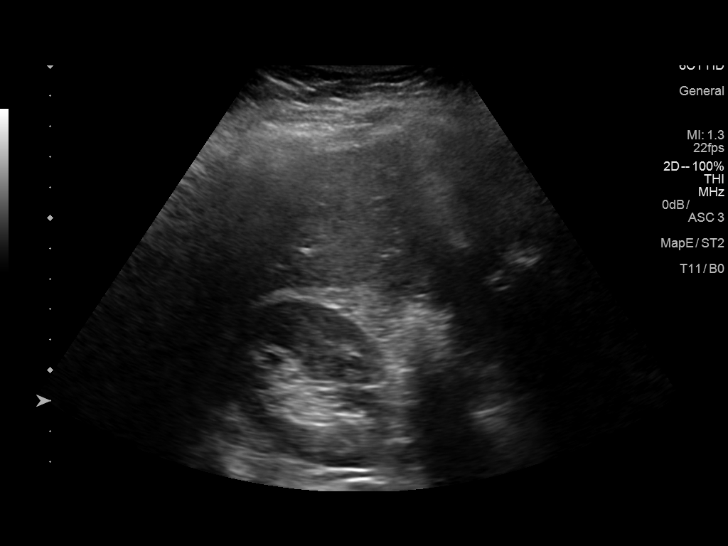
[im 11/22]
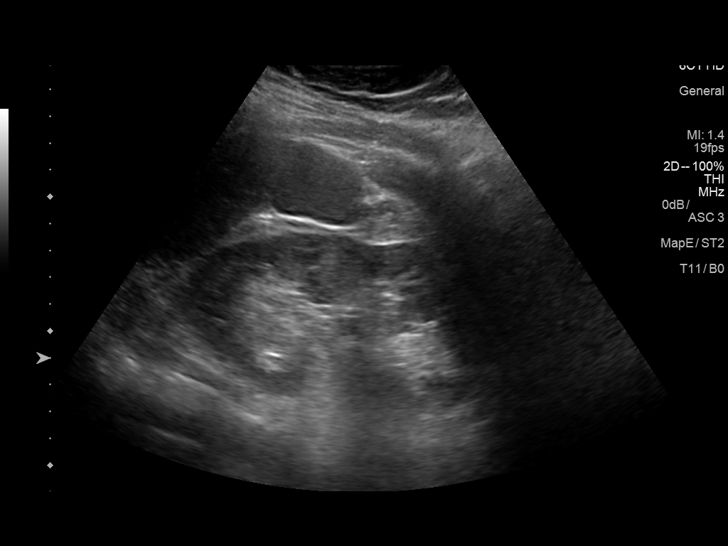
[im 12/22]
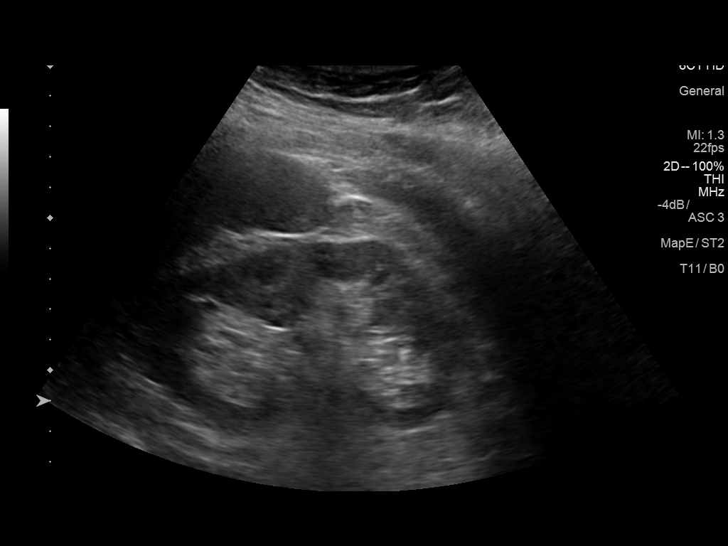
[im 14/22]
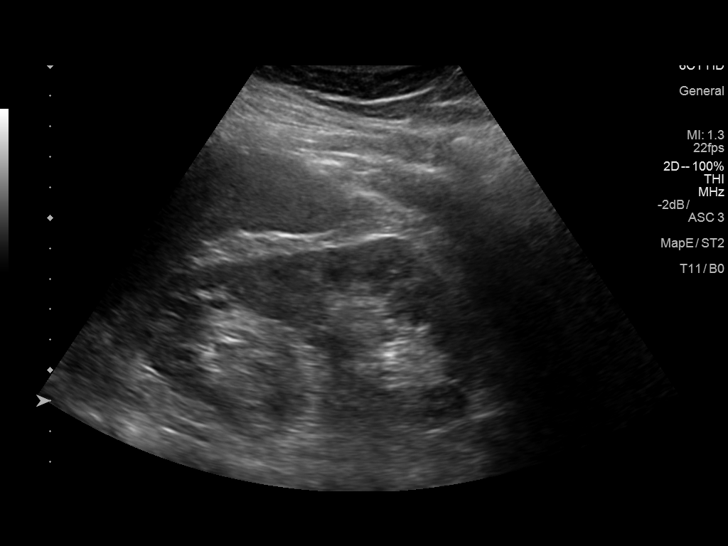
[im 15/22]
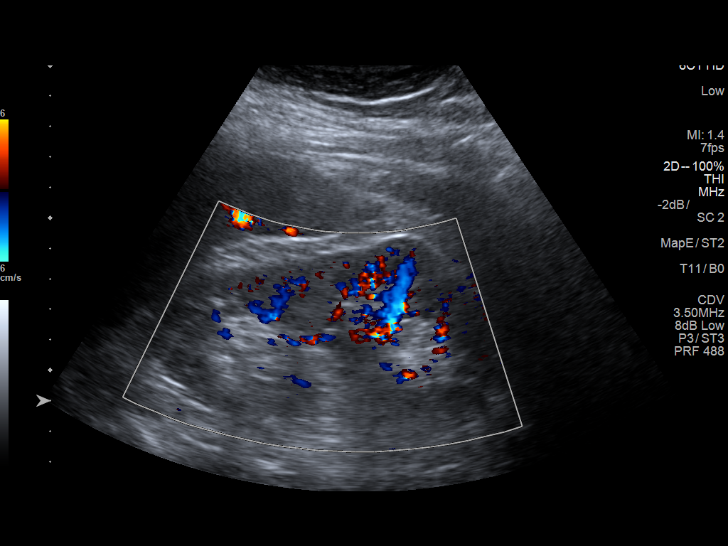
[im 17/22]
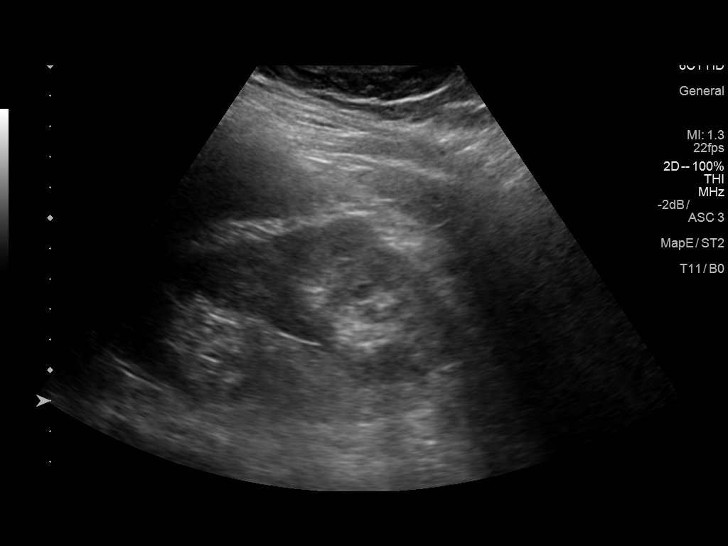
[im 19/22]
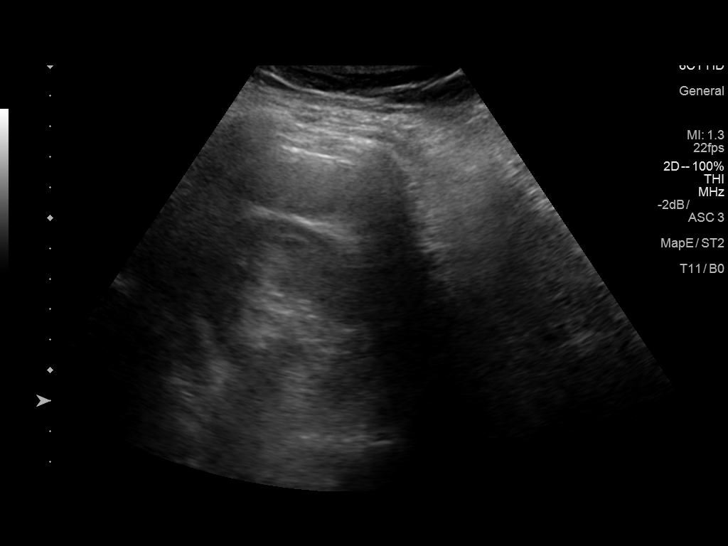
[im 20/22]
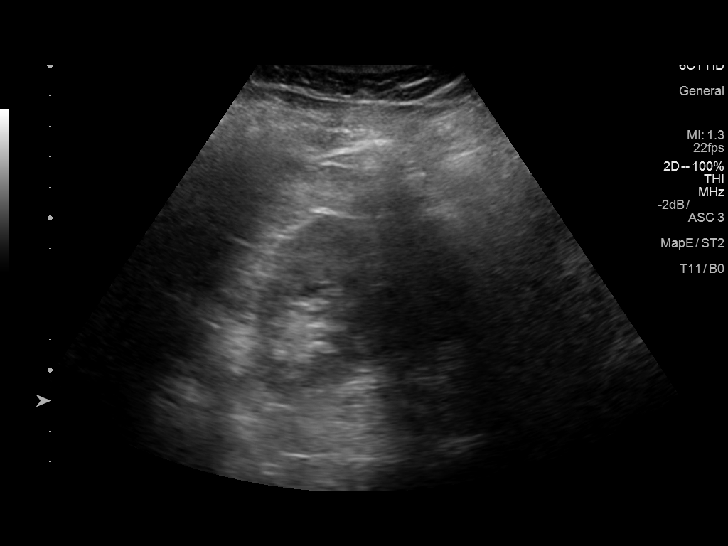
[im 22/22]
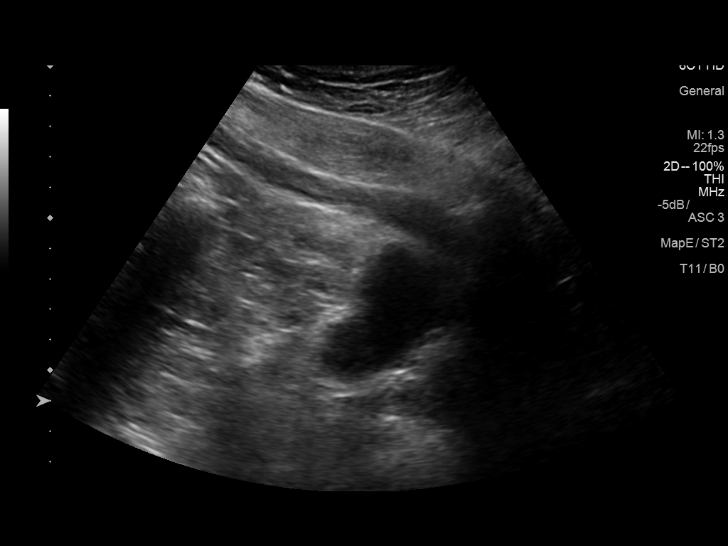

[14 of 22 positions shown; findings below may reference images not displayed]

FINDINGS: Right Kidney:

Length: 10.8 cm. Echogenicity within normal limits. No mass or
hydronephrosis visualized. No shadowing nephrolithiasis.

Left Kidney:

Length: 11.6 cm. Echogenicity within normal limits. No mass or
hydronephrosis visualized. No shadowing nephrolithiasis.

Bladder:

Appears normal for degree of bladder distention.
IMPRESSION: Normal renal ultrasound.  No hydronephrosis.

## 2016-02-09 ENCOUNTER — Other Ambulatory Visit: Payer: Self-pay

## 2016-02-09 ENCOUNTER — Encounter: Payer: Self-pay | Admitting: Adult Health

## 2016-02-09 ENCOUNTER — Ambulatory Visit (INDEPENDENT_AMBULATORY_CARE_PROVIDER_SITE_OTHER): Payer: 59 | Admitting: Adult Health

## 2016-02-09 VITALS — BP 140/70 | Temp 98.1°F | Wt 264.0 lb

## 2016-02-09 DIAGNOSIS — N529 Male erectile dysfunction, unspecified: Secondary | ICD-10-CM

## 2016-02-09 DIAGNOSIS — S161XXA Strain of muscle, fascia and tendon at neck level, initial encounter: Secondary | ICD-10-CM

## 2016-02-09 DIAGNOSIS — R519 Headache, unspecified: Secondary | ICD-10-CM

## 2016-02-09 DIAGNOSIS — R51 Headache: Secondary | ICD-10-CM

## 2016-02-09 MED ORDER — BUTALBITAL-ASPIRIN-CAFFEINE 50-325-40 MG PO CAPS
1.0000 | ORAL_CAPSULE | Freq: Four times a day (QID) | ORAL | Status: DC | PRN
Start: 2016-02-09 — End: 2017-09-05

## 2016-02-09 MED ORDER — PHENTERMINE HCL 30 MG PO CAPS: 30.0000 mg | ORAL_CAPSULE | ORAL | Status: DC

## 2016-02-09 MED FILL — PHENTERMINE 30 MG CAPSULE: 30 | 30 days supply | Qty: 30 | Fill #0

## 2016-02-09 MED FILL — BUTALBITAL COMPOUND CAPSULE: 50-325-40 | 4 days supply | Qty: 14 | Fill #0

## 2016-02-09 NOTE — Progress Notes (Signed)
Subjective:    Patient ID: Frank Pitts, male    DOB: [birth year], 25 y.o.   MRN: [id number]  HPI   Frank Pitts, presents to the office today for the continued complaint of ED. I last saw him in July, and he was complaining of this. I advised weight loss and exercise. He reports that his diet has improved and he tried a vegeterian diet but " fell off the wagon" He is exercising 2-3 times a week ( 30 min on treadmill and 30 minutes of weight lifting.   He continues to complaining of trouble maintaining an erection   Wt Readings from Last 3 Encounters:  02/09/16 264 lb (119.75 kg)  11/05/15 258 lb 8 oz (117.255 kg)  04/19/15 256 lb 3.2 oz (116.212 kg)     Review of Systems  Constitutional: Negative.   Respiratory: Negative.   Cardiovascular: Negative.   Neurological: Negative.   Psychiatric/Behavioral: Negative.    Past Medical History  Diagnosis Date  . Asthma   . Kidney stones   . Scoliosis     Social History   Social History  . Marital Status: Married    Spouse Name: Frank Pitts  . Number of Children: Frank Pitts  . Years of Education: Frank Pitts   Occupational History  . Not on file.   Social History Main Topics  . Smoking status: Never Smoker   . Smokeless tobacco: Not on file  . Alcohol Use: 0.0 oz/week    0 Standard drinks or equivalent per week     Comment: per pt rare;   Marland Kitchen. Drug Use: No  . Sexual Activity: Yes   Other Topics Concern  . 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 Knee arthroscopy  2014 and 2015  . Fractured femur    . Rod removal from prior surgery      left femur     Family History  Problem Relation Age of Onset  . Hypertension      paternal side   . Diabetes      paternal side   . Cancer Maternal Grandfather     Colon Cancer    No Known Allergies  Current Outpatient Prescriptions on File Prior to Visit  Medication Sig  Dispense Refill  . Albuterol Sulfate (PROAIR RESPICLICK) 108 (90 BASE) MCG/ACT AEPB Inhale 108 mcg into the lungs 2 (two) times daily as needed. 1 each 3  . ibuprofen (ADVIL,MOTRIN) 800 MG tablet Take 800 mg by mouth every 8 (eight) hours as needed for moderate pain.    . Multiple Vitamin (MULTIVITAMIN) tablet Take 1 tablet by mouth daily.    . ondansetron (ZOFRAN ODT) 4 MG disintegrating tablet Take 1 tablet (4 mg total) by mouth every 8 (eight) hours as needed for nausea or vomiting. (Patient not taking: Reported on 02/09/2016) 20 tablet 0  . oxyCODONE-acetaminophen (PERCOCET/ROXICET) 5-325 MG tablet Take 1 tablet by mouth every 6 (six) hours as needed for severe pain. (Patient not taking: Reported on 02/09/2016) 15 tablet 0   No current facility-administered medications on file prior to visit.    BP 140/70 mmHg  Temp(Src) 98.1 F (36.7 C) (Oral)  Wt 264 lb (119.75 kg)       Objective:   Physical Exam  Constitutional: He is oriented to person, place, and time. He appears well-developed and well-nourished. No  distress.  Cardiovascular: Normal rate, regular rhythm, normal heart sounds and intact distal pulses.  Exam reveals no gallop and no friction rub.   No murmur heard. Pulmonary/Chest: Effort normal and breath sounds normal. No respiratory distress. He has no wheezes. He has no rales. He exhibits no tenderness.  Neurological: He is alert and oriented to person, place, and time.  Skin: Skin is warm and dry. No rash noted. He is not diaphoretic. No erythema. No pallor.  Psychiatric: He has a normal mood and affect. His behavior is normal. Judgment and thought content normal.  Nursing note and vitals reviewed.     Assessment & Plan:  1. Erectile dysfunction, unspecified erectile dysfunction type - Likely from obesity.  - Testosterone, Free, Total, SHBG - Consider referral to urology  2. Morbid obesity due to excess calories (HCC) - He needs to work on diet and increase his exercise  in duration.  - phentermine 30 MG capsule; Take 1 capsule (30 mg total) by mouth every morning.  Dispense: 30 capsule; Refill: 0 - Follow up in one month   [name], NP

## 2016-02-09 NOTE — Patient Instructions (Signed)
I will follow up with you regarding your labs  Follow up with me in one month for weight check   Follow up with me for a physical   If you need anything in the meantime, please let me know

## 2016-02-14 ENCOUNTER — Telehealth: Payer: Self-pay | Admitting: Adult Health

## 2016-02-14 DIAGNOSIS — E291 Testicular hypofunction: Secondary | ICD-10-CM

## 2016-02-14 NOTE — Telephone Encounter (Signed)
Spoke to [place] and informed him of his labs results.   Testosterone 148 Free Testosterone 7.9   I would like to refer him to Urology - he is ok with this plan

## 2016-03-08 ENCOUNTER — Encounter: Payer: Self-pay | Admitting: Adult Health

## 2016-03-08 ENCOUNTER — Ambulatory Visit (INDEPENDENT_AMBULATORY_CARE_PROVIDER_SITE_OTHER): Payer: 59 | Admitting: Adult Health

## 2016-03-08 MED ORDER — PHENTERMINE HCL 37.5 MG PO CAPS: 37.5000 mg | ORAL_CAPSULE | ORAL | Status: DC

## 2016-03-08 MED FILL — PHENTERMINE 37.5 MG TABLET: 37.5 | 30 days supply | Qty: 30 | Fill #0

## 2016-03-08 NOTE — Patient Instructions (Addendum)
You are doing amazing! Keep up the great work!  I will see you in a month unless you need to see me before.   Take the new dose of phentermine the same way you took the previous prescription

## 2016-03-08 NOTE — Progress Notes (Signed)
Subjective:    Patient ID: Frank Pitts, male    DOB: 1991-06-01, 25 y.o.   MRN: [id number]  HPI  25 year old male who presents with one month follow up regarding weight loss. I last saw him one month ago and he was started on Phentermine . He was following a vegetarian diet and had increased his exercise. Unfortunately he had gained weight and was up to 265 lbs  Today in the office he reports that he is exercising for an hour x 3 times a week he continues to follow a vegetarian diet most days of the week and is only eating red meat once a week.   When he first started the phentermine he had palpitations but this soon resolved.  He has lost  19 pounds and is feeling " great", he has noticed a decrease in the amount of headaches he is getting and has only had to take his migraine medication once this month. He has also noticed that his erections are better as well as his energy level has increased  He is seeing Urology next month for his low testosterone.   Wt Readings from Last 3 Encounters:  03/08/16 245 lb 9.6 oz (111.403 kg)  02/09/16 264 lb (119.75 kg)  11/05/15 258 lb 8 oz (117.255 kg)    Review of Systems  Constitutional: Negative.   Respiratory: Negative.   Cardiovascular: Positive for palpitations (resolved).  Neurological: Negative.   Psychiatric/Behavioral: Negative.   All other systems reviewed and are negative.    Past Medical History  Diagnosis Date  . Asthma   . Kidney stones   . Scoliosis     Social History   Social History  . Marital Status: Married    Spouse Name: N/A  . Number of Children: N/A  . Years of Education: N/A   Occupational History  . Not on file.   Social History Main Topics  . Smoking status: Never Smoker   . Smokeless tobacco: Not on file  . Alcohol Use: 0.0 oz/week    0 Standard drinks or equivalent per week     Comment: per pt rare;   Marland Kitchen Drug Use: No  . Sexual Activity: Yes   Other Topics Concern  . 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 Knee arthroscopy  2014 and 2015  . Fractured femur    . Rod removal from prior surgery      left femur     Family History  Problem Relation Age of Onset  . Hypertension      paternal side   . Diabetes      paternal side   . Cancer Maternal Grandfather     Colon Cancer    No Known Allergies  Current Outpatient Prescriptions on File Prior to Visit  Medication Sig Dispense Refill  . Albuterol Sulfate (PROAIR RESPICLICK) 108 (90 BASE) MCG/ACT AEPB Inhale 108 mcg into the lungs 2 (two) times daily as needed. 1 each 3  . butalbital-aspirin-caffeine (FIORINAL) 50-325-40 MG capsule Take 1 capsule by mouth every 6 (six) hours as needed for headache. 14 capsule 5  . ibuprofen (ADVIL,MOTRIN) 800 MG tablet Take 800 mg by mouth every 8 (eight) hours as needed for moderate pain.    . Multiple Vitamin (MULTIVITAMIN) tablet Take 1 tablet by mouth daily.  No current facility-administered medications on file prior to visit.    BP 122/82 mmHg  Temp(Src) 97.8 F (36.6 C) (Oral)  Ht 5\' 11"  (1.803 m)  Wt 245 lb 9.6 oz (111.403 kg)  BMI 34.27 kg/m2       Objective:   Physical Exam  Constitutional: He is oriented to person, place, and time. He appears well-developed and well-nourished. No distress.  Cardiovascular: Normal rate, regular rhythm, normal heart sounds and intact distal pulses.  Exam reveals no gallop and no friction rub.   No murmur heard. Pulmonary/Chest: Effort normal and breath sounds normal. No respiratory distress. He has no wheezes. He has no rales. He exhibits no tenderness.  Neurological: He is alert and oriented to person, place, and time.  Skin: Skin is warm and dry. No rash noted. He is not diaphoretic. No erythema. No pallor.  Psychiatric: He has a normal mood and affect. His behavior is normal. Judgment and  thought content normal.  Nursing note and vitals reviewed.     Assessment & Plan:  1. Morbid obesity due to excess calories [facility](HCC) - He looks great and is doing everything correctly. I would imagine has he continues to work out and lose weight his testosterone level will come back up.  - Will increase phentermine from 30 to 37.5 mg.  - phentermine 37.5 MG capsule; Take 1 capsule (37.5 mg total) by mouth every morning.  Dispense: 30 capsule; Refill: 0 - Advised to add weight lifting to his routine .  - Continue with diet and exercise - Follow up in one month or sooner if needed   Shirline Freesory Ryanne Morand, NP

## 2016-04-05 ENCOUNTER — Ambulatory Visit (INDEPENDENT_AMBULATORY_CARE_PROVIDER_SITE_OTHER): Payer: 59 | Admitting: Adult Health

## 2016-04-05 ENCOUNTER — Encounter: Payer: Self-pay | Admitting: Adult Health

## 2016-04-05 MED ORDER — PHENTERMINE HCL 37.5 MG PO CAPS: 37.5000 mg | ORAL_CAPSULE | ORAL | Status: DC

## 2016-04-05 MED FILL — PHENTERMINE 37.5 MG TABLET: 37.5 | 30 days supply | Qty: 30 | Fill #0

## 2016-04-05 NOTE — Patient Instructions (Signed)
It was great seeing you again.   As discussed, lets do one more month of phentermine.   Call up here at the end of the month and let me know what your weight is.   We will take August off from phentermine.   Follow up with me in September to discuss if we need to restart phentermine

## 2016-04-05 NOTE — Progress Notes (Signed)
Subjective:    Patient ID: Frank Pitts, male    DOB: March 15, 1991, 25 y.o.   MRN: [id number]  HPI  25 year old male who presents to the office today for follow up regarding weight loss. He has had significant weight loss since starting phentermine. He was eating a vegan diet and only having red meat once per day. He was also exercising for atleast an hour 3 days per week.  Today in the office he reports that he is feeling " great". He continues to exercise multiple times per week and has gone almost completely vegan. He has started taking a vitamin B 12 supplement.    Wt Readings from Last 3 Encounters:  04/05/16 231 lb (104.781 kg)  03/08/16 245 lb 9.6 oz (111.403 kg)  02/09/16 264 lb (119.75 kg)     Review of Systems  Constitutional: Negative.   HENT: Negative.   Eyes: Negative.   Respiratory: Negative.   Cardiovascular: Negative.   Gastrointestinal: Negative.   Endocrine: Negative.   Genitourinary: Negative.   Musculoskeletal: Negative.   Neurological: Negative.   Psychiatric/Behavioral: Negative.   All other systems reviewed and are negative.  Past Medical History  Diagnosis Date  . Asthma   . Kidney stones   . Scoliosis     Social History   Social History  . Marital Status: Married    Spouse Name: N/A  . Number of Children: N/A  . Years of Education: N/A   Occupational History  . Not on file.   Social History Main Topics  . Smoking status: Never Smoker   . Smokeless tobacco: Not on file  . Alcohol Use: 0.0 oz/week    0 Standard drinks or equivalent per week     Comment: per pt rare;   Marland Kitchen. Drug Use: No  . Sexual Activity: Yes   Other Topics Concern  . 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 Knee arthroscopy  2014 and 2015  . Fractured femur    . Rod removal from prior surgery      left femur     Family History    Problem Relation Age of Onset  . Hypertension      paternal side   . Diabetes      paternal side   . Cancer Maternal Grandfather     Colon Cancer    No Known Allergies  Current Outpatient Prescriptions on File Prior to Visit  Medication Sig Dispense Refill  . Albuterol Sulfate (PROAIR RESPICLICK) 108 (90 BASE) MCG/ACT AEPB Inhale 108 mcg into the lungs 2 (two) times daily as needed. 1 each 3  . butalbital-aspirin-caffeine (FIORINAL) 50-325-40 MG capsule Take 1 capsule by mouth every 6 (six) hours as needed for headache. 14 capsule 5  . ibuprofen (ADVIL,MOTRIN) 800 MG tablet Take 800 mg by mouth every 8 (eight) hours as needed for moderate pain.    . Multiple Vitamin (MULTIVITAMIN) tablet Take 1 tablet by mouth daily.     No current facility-administered medications on file prior to visit.    BP 128/86 mmHg  Pulse 96  Ht 5\' 11"  (1.803 m)  Wt 231 lb (104.781 kg)  BMI 32.23 kg/m2  SpO2 98%        Objective:   Physical Exam  Constitutional: He is oriented to person, place, and time.  He appears well-developed and well-nourished. No distress.  Cardiovascular: Normal rate, regular rhythm, normal heart sounds and intact distal pulses.  Exam reveals no gallop and no friction rub.   No murmur heard. Pulmonary/Chest: Effort normal and breath sounds normal. No respiratory distress. He has no wheezes. He has no rales. He exhibits no tenderness.  Neurological: He is alert and oriented to person, place, and time. He has normal reflexes. He displays normal reflexes. No cranial nerve deficit. He exhibits normal muscle tone. Coordination normal.  Skin: Skin is warm and dry. No rash noted. He is not diaphoretic. No erythema. No pallor.  Psychiatric: He has a normal mood and affect. His behavior is normal. Judgment and thought content normal.  Nursing note and vitals reviewed.     Assessment & Plan:  1. Morbid obesity due to excess calories [facility](HCC) - He looks great! Has lost approx 30 pounds  in the last two months.  - phentermine 37.5 MG capsule; Take 1 capsule (37.5 mg total) by mouth every morning.  Dispense: 30 capsule; Refill: 0 - Will do one more month of phentermine. Take a month off and then follow up in September.  - Consider continuing phentermine in September for an additional 3 months.  - Follow up if needed before that   Shirline Freesory Jacquie Lukes, NP

## 2016-04-05 NOTE — Progress Notes (Signed)
Pre visit review using our clinic review tool, if applicable. No additional management support is needed unless otherwise documented below in the visit note. 

## 2016-04-10 DIAGNOSIS — E291 Testicular hypofunction: Secondary | ICD-10-CM | POA: Diagnosis not present

## 2016-04-10 DIAGNOSIS — N5201 Erectile dysfunction due to arterial insufficiency: Secondary | ICD-10-CM | POA: Diagnosis not present

## 2016-04-19 DIAGNOSIS — Z125 Encounter for screening for malignant neoplasm of prostate: Secondary | ICD-10-CM | POA: Diagnosis not present

## 2016-04-19 DIAGNOSIS — E291 Testicular hypofunction: Secondary | ICD-10-CM | POA: Diagnosis not present

## 2016-04-19 LAB — PSA: PSA: 0.32

## 2016-05-01 ENCOUNTER — Ambulatory Visit (INDEPENDENT_AMBULATORY_CARE_PROVIDER_SITE_OTHER): Payer: 59 | Admitting: Adult Health

## 2016-05-01 ENCOUNTER — Encounter: Payer: Self-pay | Admitting: Adult Health

## 2016-05-01 VITALS — BP 120/72 | Temp 98.1°F | Ht 71.0 in | Wt 221.7 lb

## 2016-05-01 DIAGNOSIS — M25562 Pain in left knee: Secondary | ICD-10-CM | POA: Diagnosis not present

## 2016-05-01 DIAGNOSIS — M25561 Pain in right knee: Secondary | ICD-10-CM | POA: Diagnosis not present

## 2016-05-01 NOTE — Progress Notes (Signed)
Subjective:    Patient ID: Frank Pitts, male    DOB: 21-Jun-1991, 25 y.o.   MRN: [id number]  HPI  25 year old male who presents to the office today to have paperwork filled out for the [place] due to constant knee pain. He has had knee arthoscopy in 2014 and 2015 while in the [employer]. He reports that the pain continues and in some instances is becoming worse. He has problems with stiffness and pain with standing and sitting for extended periods of time.   Per patient " [facility]  I get this [facility] filled out by my PCP."     Review of Systems  Constitutional: Negative.   Respiratory: Negative.   Cardiovascular: Negative.   Gastrointestinal: Negative.   Musculoskeletal: Positive for arthralgias and myalgias. Negative for gait problem, joint swelling, neck pain and neck stiffness.  Skin: Negative.   All other systems reviewed and are negative.  Past Medical History:  Diagnosis Date  . Asthma   . Kidney stones   . Scoliosis     Social History   Social History  . Marital status: Married    Spouse name: N/A  . Number of children: N/A  . Years of education: N/A   Occupational History  . Not on file.   Social History Main Topics  . Smoking status: Never Smoker  . Smokeless tobacco: Not on file  . Alcohol use 0.0 oz/week     Comment: per pt rare;   Marland Kitchen Drug use: No  . Sexual activity: Yes   Other Topics Concern  . 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 fractured femur    . KNEE ARTHROSCOPY  2014 and 2015  . rod removal from prior surgery     left femur     Family History  Problem Relation Age of Onset  . Hypertension      paternal side   . Diabetes      paternal side   . Cancer Maternal Grandfather     Colon Cancer    No Known Allergies  Current Outpatient Prescriptions on File Prior to Visit    Medication Sig Dispense Refill  . Albuterol Sulfate (PROAIR RESPICLICK) 108 (90 BASE) MCG/ACT AEPB Inhale 108 mcg into the lungs 2 (two) times daily as needed. 1 each 3  . butalbital-aspirin-caffeine (FIORINAL) 50-325-40 MG capsule Take 1 capsule by mouth every 6 (six) hours as needed for headache. 14 capsule 5  . ibuprofen (ADVIL,MOTRIN) 800 MG tablet Take 800 mg by mouth every 8 (eight) hours as needed for moderate pain.    . Multiple Vitamin (MULTIVITAMIN) tablet Take 1 tablet by mouth daily.    . phentermine 37.5 MG capsule Take 1 capsule (37.5 mg total) by mouth every morning. 30 capsule 0   No current facility-administered medications on file prior to visit.     BP 120/72   Temp 98.1 F (36.7 C) (Oral)   Ht  (1.803 m)   Wt 221 lb 11.2 oz (100.6 kg)   BMI 30.92 kg/m       Objective:   Physical Exam  Constitutional: He is oriented to person, place, and time. He appears well-developed and well-nourished. No distress.  Cardiovascular: Normal rate, regular rhythm, normal heart sounds and intact distal pulses.  Exam reveals no gallop and no friction rub.   No murmur heard. Pulmonary/Chest: Effort normal and breath sounds normal. No respiratory distress. He has no wheezes. He has no rales. He exhibits no tenderness.  Musculoskeletal: He exhibits tenderness.       Right knee: He exhibits decreased range of motion and bony tenderness. He exhibits no swelling, no effusion, no ecchymosis, no deformity, normal patellar mobility, normal meniscus and no MCL laxity. Tenderness found. LCL and patellar tendon tenderness noted. No medial joint line and no lateral joint line tenderness noted.       Left knee: He exhibits decreased range of motion, bony tenderness and abnormal meniscus. He exhibits no swelling and no MCL laxity. Tenderness found. MCL, LCL and patellar tendon tenderness noted.  Pain to bilateral knees with knee to chest, internal rotation and external rotation  Neurological:  He is alert and oriented to person, place, and time. He has normal reflexes.  Skin: Skin is warm and dry. He is not diaphoretic. No erythema. No pallor.  Psychiatric: He has a normal mood and affect. His behavior is normal. Judgment and thought content normal.  Nursing note and vitals reviewed.     Assessment & Plan:  1. Bilateral knee pain - He would like to hold off on any treatment until he is seen at the [facility] -Paperwork was filled out and given to his wife  - follow up as needed  [name], NP

## 2016-05-13 DIAGNOSIS — E291 Testicular hypofunction: Secondary | ICD-10-CM | POA: Diagnosis not present

## 2016-05-13 MED FILL — CLOMIPHENE CITRATE 50 MG TA: 50 | 60 days supply | Qty: 30 | Fill #0

## 2016-05-17 DIAGNOSIS — H52223 Regular astigmatism, bilateral: Secondary | ICD-10-CM | POA: Diagnosis not present

## 2016-06-05 ENCOUNTER — Telehealth: Payer: Self-pay | Admitting: Adult Health

## 2016-06-05 NOTE — Telephone Encounter (Signed)
Please advise 

## 2016-06-05 NOTE — Telephone Encounter (Signed)
Has he been able to keep weight off?  What is his current weight

## 2016-06-05 NOTE — Telephone Encounter (Signed)
Pt would like to know if he can get back on  phentermine 37.5 MG capsule  [facility] out pt pharm

## 2016-06-06 NOTE — Telephone Encounter (Signed)
Patient states his current weight is 214.  Patient states that he has been seeing Urology and they have him on Clomid, but feels like he could be losing more weight on Phentermine.

## 2016-06-07 ENCOUNTER — Other Ambulatory Visit: Payer: Self-pay | Admitting: Adult Health

## 2016-06-07 MED ORDER — PHENTERMINE HCL 37.5 MG PO CAPS: 37.5000 mg | ORAL_CAPSULE | ORAL | 0 refills | Status: DC

## 2016-06-07 MED FILL — PHENTERMINE 37.5 MG TABLET: 37.5 | 30 days supply | Qty: 30 | Fill #0

## 2016-07-03 MED FILL — CLOMIPHENE CITRATE 50 MG TA: 50 | 60 days supply | Qty: 30 | Fill #1

## 2016-08-20 DIAGNOSIS — E291 Testicular hypofunction: Secondary | ICD-10-CM | POA: Diagnosis not present

## 2016-08-26 DIAGNOSIS — E291 Testicular hypofunction: Secondary | ICD-10-CM | POA: Diagnosis not present

## 2016-09-10 ENCOUNTER — Other Ambulatory Visit: Payer: Self-pay

## 2016-09-10 ENCOUNTER — Telehealth: Payer: Self-pay | Admitting: Adult Health

## 2016-09-10 MED ORDER — PHENTERMINE HCL 37.5 MG PO CAPS: 37.5000 mg | ORAL_CAPSULE | ORAL | 0 refills | Status: DC

## 2016-09-10 MED FILL — PHENTERMINE 37.5 MG TABLET: 37.5 | 30 days supply | Qty: 30 | Fill #0

## 2016-09-10 NOTE — Telephone Encounter (Signed)
Patient states his current weight is 206.  Please advise on refill. Patient would also like to know if there are any pre-workout interactions? Thanks.

## 2016-09-10 NOTE — Telephone Encounter (Signed)
Patient dropped by office requesting refill on  Phentermine, one month. Patient uses [facility]. Patient wants to know if there are any pre-workout interactions. Please advise.

## 2016-09-10 NOTE — Telephone Encounter (Signed)
Rx refilled. Left message for patient to return phone call.

## 2016-09-10 NOTE — Telephone Encounter (Signed)
Ok to refill.   There should be no pre-workout interactions

## 2016-10-16 MED FILL — CLOMIPHENE CITRATE 50 MG TA: 50 | 60 days supply | Qty: 30 | Fill #2

## 2017-01-22 ENCOUNTER — Ambulatory Visit (INDEPENDENT_AMBULATORY_CARE_PROVIDER_SITE_OTHER): Payer: 59 | Admitting: Adult Health

## 2017-01-22 VITALS — BP 112/72 | Temp 98.1°F | Ht 71.0 in | Wt 181.0 lb

## 2017-01-22 DIAGNOSIS — R05 Cough: Secondary | ICD-10-CM

## 2017-01-22 DIAGNOSIS — R059 Cough, unspecified: Secondary | ICD-10-CM

## 2017-01-22 MED ORDER — ALBUTEROL SULFATE 108 (90 BASE) MCG/ACT IN AEPB: 108.0000 ug | INHALATION_SPRAY | Freq: Two times a day (BID) | RESPIRATORY_TRACT | 3 refills | Status: DC | PRN

## 2017-01-22 MED FILL — PROAIR RESPICLICK INHAL PWD: 108 (90 BAS | 50 days supply | Qty: 1 | Fill #0

## 2017-01-22 NOTE — Progress Notes (Signed)
Subjective:    Patient ID: Frank Pitts, male    DOB: November 16, 1990, 26 y.o.   MRN: [id number]  Cough  The current episode started in the past 7 days. The cough is non-productive. Associated symptoms include wheezing. Pertinent negatives include no chest pain, ear congestion, ear pain, fever, nasal congestion, postnasal drip, rhinorrhea, sore throat or shortness of breath. The symptoms are aggravated by lying down. Treatments tried: Zyrtec  The treatment provided no relief.    He has been out of his albuterol inhaler    Review of Systems  Constitutional: Negative for activity change and fever.  HENT: Negative for ear pain, postnasal drip, rhinorrhea and sore throat.   Respiratory: Positive for cough, chest tightness and wheezing. Negative for shortness of breath.   Cardiovascular: Negative.  Negative for chest pain.  Genitourinary: Negative.   Neurological: Negative.    Past Medical History:  Diagnosis Date  . Asthma   . Kidney stones   . Scoliosis     Social History   Social History  . Marital status: Married    Spouse name: N/A  . Number of children: N/A  . Years of education: N/A   Occupational History  . Not on file.   Social History Main Topics  . Smoking status: Never Smoker  . Smokeless tobacco: Not on file  . Alcohol use 0.0 oz/week     Comment: per pt rare;   Marland Kitchen Drug use: No  . Sexual activity: Yes   Other Topics Concern  . 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 fractured femur    . KNEE ARTHROSCOPY  2014 and 2015  . rod removal from prior surgery     left femur     Family History  Problem Relation Age of Onset  . Hypertension      paternal side   . Diabetes      paternal side   . Cancer Maternal Grandfather     Colon Cancer    No Known Allergies  Current Outpatient Prescriptions on File Prior to Visit    Medication Sig Dispense Refill  . butalbital-aspirin-caffeine (FIORINAL) 50-325-40 MG capsule Take 1 capsule by mouth every 6 (six) hours as needed for headache. 14 capsule 5  . ibuprofen (ADVIL,MOTRIN) 800 MG tablet Take 800 mg by mouth every 8 (eight) hours as needed for moderate pain.    . Multiple Vitamin (MULTIVITAMIN) tablet Take 1 tablet by mouth daily.    . phentermine 37.5 MG capsule Take 1 capsule (37.5 mg total) by mouth every morning. 30 capsule 0   No current facility-administered medications on file prior to visit.     BP 112/72 (BP Location: Left Arm, Patient Position: Sitting, Cuff Size: Normal)   Temp 98.1 F (36.7 C) (Oral)   Ht  (1.803 m)   Wt 181 lb (82.1 kg)   BMI 25.24 kg/m       Objective:   Physical Exam  Constitutional: He is oriented to person, place, and time. He appears well-developed and well-nourished. No distress.  Cardiovascular: Normal rate, regular rhythm, normal heart sounds and intact distal pulses.  Exam reveals no gallop.   No murmur heard. Pulmonary/Chest: Effort normal and breath sounds normal. No respiratory distress. He has no wheezes. He has no rales. He exhibits no tenderness.  Neurological: He  is alert and oriented to person, place, and time.  Skin: Skin is warm and dry. No rash noted. He is not diaphoretic. No erythema. No pallor.  Psychiatric: He has a normal mood and affect. His behavior is normal. Judgment and thought content normal.  Nursing note and vitals reviewed.     Assessment & Plan:  1. Cough - Appears to be allergy related. Will refill Proair. Continue with Zyrtec.  - Albuterol Sulfate (PROAIR RESPICLICK) 108 (90 Base) MCG/ACT AEPB; Inhale 108 mcg into the lungs 2 (two) times daily as needed.  Dispense: 1 each; Refill: 3 - Follow up if no improvement after restarting ProAir inhaler.  - Follow up sooner if fever develops   [name], NP

## 2017-02-17 DIAGNOSIS — E291 Testicular hypofunction: Secondary | ICD-10-CM | POA: Diagnosis not present

## 2017-02-24 DIAGNOSIS — E291 Testicular hypofunction: Secondary | ICD-10-CM | POA: Diagnosis not present

## 2017-02-27 ENCOUNTER — Encounter: Payer: Self-pay | Admitting: Family Medicine

## 2017-04-28 MED FILL — CLOMIPHENE CITRATE 50 MG TA: 50 | 60 days supply | Qty: 30 | Fill #3

## 2017-05-20 DIAGNOSIS — H5213 Myopia, bilateral: Secondary | ICD-10-CM | POA: Diagnosis not present

## 2017-06-27 ENCOUNTER — Encounter: Payer: Self-pay | Admitting: Adult Health

## 2017-09-04 ENCOUNTER — Ambulatory Visit (INDEPENDENT_AMBULATORY_CARE_PROVIDER_SITE_OTHER): Payer: 59 | Admitting: Psychology

## 2017-09-04 DIAGNOSIS — F320 Major depressive disorder, single episode, mild: Secondary | ICD-10-CM | POA: Diagnosis not present

## 2017-09-05 ENCOUNTER — Encounter: Payer: Self-pay | Admitting: Adult Health

## 2017-09-05 ENCOUNTER — Ambulatory Visit (INDEPENDENT_AMBULATORY_CARE_PROVIDER_SITE_OTHER): Payer: 59 | Admitting: Adult Health

## 2017-09-05 VITALS — BP 118/80 | Temp 98.1°F | Wt 234.0 lb

## 2017-09-05 DIAGNOSIS — F419 Anxiety disorder, unspecified: Secondary | ICD-10-CM

## 2017-09-05 DIAGNOSIS — R51 Headache: Secondary | ICD-10-CM | POA: Diagnosis not present

## 2017-09-05 DIAGNOSIS — R519 Headache, unspecified: Secondary | ICD-10-CM

## 2017-09-05 MED ORDER — BUTALBITAL-ASPIRIN-CAFFEINE 50-325-40 MG PO CAPS: 1.0000 | ORAL_CAPSULE | Freq: Four times a day (QID) | ORAL | 5 refills | Status: DC | PRN

## 2017-09-05 MED ORDER — CITALOPRAM HYDROBROMIDE 10 MG PO TABS: 10.0000 mg | ORAL_TABLET | Freq: Every day | ORAL | 3 refills | Status: DC

## 2017-09-05 MED FILL — BUTALBITAL COMPOUND CAPSULE: 50-325-40 | 3 days supply | Qty: 14 | Fill #0

## 2017-09-05 MED FILL — CITALOPRAM HBR 10 MG TABLET: 10 | 30 days supply | Qty: 30 | Fill #0

## 2017-09-05 NOTE — Progress Notes (Signed)
Subjective:    Patient ID: Frank Pitts, male    DOB: 1991-08-13, 26 y.o.   MRN: [id number]  HPI  26 year old male who  has a past medical history of Asthma, Kidney stones, and Scoliosis.  He presents to the office today to discuss anxiety medication. He has been seeing a Veterinary surgeoncounselor for marriage issues and believes that most of his anxiety is stemming from this issue. He denies any depression but feels as though he is always on edge with his marriage and wife. He would like to try something to help him " calm down"  He would also like his migraine medication refilled   Review of Systems See HPI   Past Medical History:  Diagnosis Date  . Asthma   . Kidney stones   . Scoliosis     Social History   Socioeconomic History  . Marital status: Married    Spouse name: Not on file  . Number of children: Not on file  . Years of education: Not on file  . Highest education level: Not on file  Social Needs  . Financial resource strain: Not on file  . Food insecurity - worry: Not on file  . Food insecurity - inability: Not on file  . Transportation needs - medical: Not on file  . Transportation needs - non-medical: Not on file  Occupational History  . Not on file  Tobacco Use  . Smoking status: Never Smoker  . Smokeless tobacco: Never Used  Substance and Sexual Activity  . Alcohol use: Yes    Alcohol/week: 0.0 oz    Comment: per pt rare;   Marland Kitchen. Drug use: No  . Sexual activity: Yes  Other Topics Concern  . 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 fractured femur    . KNEE ARTHROSCOPY  2014 and 2015  . rod removal from prior surgery     left femur     Family History  Problem Relation Age of Onset  . Hypertension Unknown        paternal side   . Diabetes Unknown        paternal side   . Cancer Maternal Grandfather        Colon Cancer    No Known  Allergies  Current Outpatient Medications on File Prior to Visit  Medication Sig Dispense Refill  . Albuterol Sulfate (PROAIR RESPICLICK) 108 (90 Base) MCG/ACT AEPB Inhale 108 mcg into the lungs 2 (two) times daily as needed. 1 each 3  . ibuprofen (ADVIL,MOTRIN) 800 MG tablet Take 800 mg by mouth every 8 (eight) hours as needed for moderate pain.    . Multiple Vitamin (MULTIVITAMIN) tablet Take 1 tablet by mouth daily.    . clomiPHENE (CLOMID) 50 MG tablet Take 25 mg by mouth daily.     No current facility-administered medications on file prior to visit.     BP 118/80 (BP Location: Left Arm)   Temp 98.1 F (36.7 C) (Oral)   Wt 234 lb (106.1 kg)   BMI 32.64 kg/m       Objective:   Physical Exam  Constitutional: He is oriented to person, place, and time. He appears well-developed and well-nourished. No distress.  Cardiovascular: Normal rate, regular rhythm, normal heart sounds and intact distal pulses. Exam reveals no friction rub.  No murmur heard. Pulmonary/Chest:  Effort normal and breath sounds normal. No respiratory distress. He has no wheezes. He has no rales. He exhibits no tenderness.  Neurological: He is alert and oriented to person, place, and time.  Skin: Skin is warm and dry. No rash noted. He is not diaphoretic. No erythema. No pallor.  Psychiatric: He has a normal mood and affect. His behavior is normal. Thought content normal.  Nursing note and vitals reviewed.     Assessment & Plan:  1. Anxiety - We discussed medications and side effects of SSRI. He would like to try Celexa. I will start him off on 10 mg and follow up in one month  - citalopram (CELEXA) 10 MG tablet; Take 1 tablet (10 mg total) by mouth daily.  Dispense: 30 tablet; Refill: 3  2. Headache behind the eyes  - butalbital-aspirin-caffeine (FIORINAL) 50-325-40 MG capsule; Take 1 capsule by mouth every 6 (six) hours as needed for headache.  Dispense: 14 capsule; Refill: 5   Shirline Freesory Bartosz Luginbill, NP

## 2017-09-26 ENCOUNTER — Ambulatory Visit: Payer: 59 | Admitting: Psychology

## 2017-10-21 ENCOUNTER — Ambulatory Visit: Payer: Self-pay | Admitting: Adult Health

## 2018-01-12 ENCOUNTER — Telehealth: Payer: Self-pay | Admitting: Adult Health

## 2018-01-12 NOTE — Telephone Encounter (Signed)
Copied from CRM [id number]. Topic: Quick Communication - Rx Refill/Question >> Jan 12, 2018 11:55 AM Leafy Roobinson, Norma J wrote: Medication: new rx phentermine 30 mg #30 only. Pt is just trying to jump start his weight loss. Pt was last seen nov 2018. [facility]. Pt is aware he may need to make an appt

## 2018-01-13 NOTE — Telephone Encounter (Signed)
Spoke to the pt and advised an office visit with Kandee Keenory.  Pt now scheduled for 01/14/18 @ 10 AM.

## 2018-01-14 ENCOUNTER — Ambulatory Visit: Payer: Self-pay | Admitting: Adult Health

## 2018-05-15 ENCOUNTER — Ambulatory Visit (INDEPENDENT_AMBULATORY_CARE_PROVIDER_SITE_OTHER): Payer: Self-pay | Admitting: Adult Health

## 2018-05-15 ENCOUNTER — Encounter: Payer: Self-pay | Admitting: Adult Health

## 2018-05-15 VITALS — BP 110/74 | Temp 98.1°F | Wt 248.0 lb

## 2018-05-15 DIAGNOSIS — E668 Other obesity: Secondary | ICD-10-CM

## 2018-05-15 MED ORDER — PHENTERMINE HCL 15 MG PO CAPS: 15.0000 mg | ORAL_CAPSULE | ORAL | 0 refills | Status: DC

## 2018-05-15 MED FILL — PHENTERMINE 15 MG CAPSULE: 15 | 30 days supply | Qty: 30 | Fill #0

## 2018-05-15 NOTE — Progress Notes (Signed)
Subjective:    Patient ID: Frank Pitts, male    DOB: 1991/01/16, 27 y.o.   MRN: [id number]  HPI 27  Year old male who  has a past medical history of Anxiety, Asthma, Kidney stones, and Scoliosis. He presents to the office today to discuss going back on phentermine. He was on this medication in 2017 and did very well. He was able to drop from 256 to 181. Unfortunately, when we stopped phentermine he got out of his diet and exercise habits. He is now back up to 248 lbs. He would like to go back on phentermine again.    Wt Readings from Last 3 Encounters:  05/15/18 248 lb (112.5 kg)  09/05/17 234 lb (106.1 kg)  01/22/17 181 lb (82.1 kg)    Review of Systems See HPI   Past Medical History:  Diagnosis Date  . Anxiety   . Asthma   . Kidney stones   . Scoliosis     Social History   Socioeconomic History  . Marital status: Married    Spouse name: Not on file  . Number of children: Not on file  . Years of education: Not on file  . Highest education level: Not on file  Occupational History  . Not on file  Social Needs  . Financial resource strain: Not on file  . Food insecurity:    Worry: Not on file    Inability: Not on file  . Transportation needs:    Medical: Not on file    Non-medical: Not on file  Tobacco Use  . Smoking status: Never Smoker  . Smokeless tobacco: Never Used  Substance and Sexual Activity  . Alcohol use: Yes    Alcohol/week: 0.0 standard drinks    Comment: per pt rare;   Marland Kitchen. Drug use: No  . Sexual activity: Yes  Lifestyle  . Physical activity:    Days per week: Not on file    Minutes per session: Not on file  . Stress: Not on file  Relationships  . Social connections:    Talks on phone: Not on file    Gets together: Not on file    Attends religious service: Not on file    Active member of club or organization: Not on file    Attends meetings of clubs or organizations: Not on file    Relationship status: Not on file  . Intimate partner  violence:    Fear of current or ex partner: Not on file    Emotionally abused: Not on file    Physically abused: Not on file    Forced sexual activity: Not on file  Other Topics Concern  . 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 fractured femur    . KNEE ARTHROSCOPY  2014 and 2015  . rod removal from prior surgery     left femur     Family History  Problem Relation Age of Onset  . Hypertension Unknown        paternal side   . Diabetes Unknown        paternal side   . Cancer Maternal Grandfather        Colon Cancer    No Known Allergies  Current Outpatient Medications on File Prior to Visit  Medication Sig Dispense Refill  . Albuterol Sulfate (PROAIR RESPICLICK) 108 (90  Base) MCG/ACT AEPB Inhale 108 mcg into the lungs 2 (two) times daily as needed. 1 each 3  . butalbital-aspirin-caffeine (FIORINAL) 50-325-40 MG capsule Take 1 capsule by mouth every 6 (six) hours as needed for headache. 14 capsule 5  . citalopram (CELEXA) 10 MG tablet Take 1 tablet (10 mg total) by mouth daily. 30 tablet 3  . ibuprofen (ADVIL,MOTRIN) 800 MG tablet Take 800 mg by mouth every 8 (eight) hours as needed for moderate pain.    . Multiple Vitamin (MULTIVITAMIN) tablet Take 1 tablet by mouth daily.     No current facility-administered medications on file prior to visit.     BP 110/74   Temp 98.1 F (36.7 C) (Oral)   Wt 248 lb (112.5 kg)   BMI 34.59 kg/m       Objective:   Physical Exam  Constitutional: He is oriented to person, place, and time. He appears well-developed and well-nourished. No distress.  Obese   Eyes: Pupils are equal, round, and reactive to light. EOM are normal.  Cardiovascular: Normal rate, regular rhythm, normal heart sounds and intact distal pulses.  Pulmonary/Chest: Effort normal and breath sounds normal.  Neurological: He is alert and  oriented to person, place, and time.  Skin: Skin is warm and dry. He is not diaphoretic.  Psychiatric: He has a normal mood and affect. His behavior is normal. Judgment and thought content normal.  Nursing note and vitals reviewed.     Assessment & Plan:  1. Other obesity - We discussed that life style modifications he needs to make are life long, not just with taking phentermine.  We will do another short course of phentermine. He is self pay at this time, he will follow up every other month. Encouraged to work on heart healthy diet and exercise  - phentermine 15 MG capsule; Take 1 capsule (15 mg total) by mouth every morning.  Dispense: 30 capsule; Refill: 0  [name], NP

## 2018-05-15 NOTE — Addendum Note (Signed)
Addended by: Nancy FetterNAFZIGER, Reannon Candella L on: 05/15/2018 02:25 PM   Modules accepted: Level of Service

## 2018-06-12 ENCOUNTER — Other Ambulatory Visit: Payer: Self-pay | Admitting: Adult Health

## 2018-06-12 DIAGNOSIS — E668 Other obesity: Secondary | ICD-10-CM

## 2018-06-12 MED ORDER — PHENTERMINE HCL 15 MG PO CAPS: 15.0000 mg | ORAL_CAPSULE | ORAL | 0 refills | Status: DC

## 2018-06-12 MED FILL — PHENTERMINE 15 MG CAPSULE: 15 | 30 days supply | Qty: 30 | Fill #0

## 2019-01-05 ENCOUNTER — Other Ambulatory Visit: Payer: Self-pay | Admitting: Adult Health

## 2019-01-05 DIAGNOSIS — E668 Other obesity: Secondary | ICD-10-CM

## 2019-01-06 ENCOUNTER — Other Ambulatory Visit: Payer: Self-pay | Admitting: Adult Health

## 2019-01-06 DIAGNOSIS — E668 Other obesity: Secondary | ICD-10-CM

## 2019-01-06 NOTE — Telephone Encounter (Signed)
Sent to the pharmacy by e-scribe. 

## 2019-01-13 ENCOUNTER — Encounter: Payer: Self-pay | Admitting: Adult Health

## 2019-01-14 ENCOUNTER — Other Ambulatory Visit: Payer: Self-pay

## 2019-01-14 ENCOUNTER — Encounter: Payer: Self-pay | Admitting: Adult Health

## 2019-01-14 ENCOUNTER — Ambulatory Visit (INDEPENDENT_AMBULATORY_CARE_PROVIDER_SITE_OTHER): Payer: Self-pay | Admitting: Adult Health

## 2019-01-14 VITALS — Wt 209.0 lb

## 2019-01-14 DIAGNOSIS — E668 Other obesity: Secondary | ICD-10-CM

## 2019-01-14 MED ORDER — PHENTERMINE HCL 15 MG PO CAPS: 15.0000 mg | ORAL_CAPSULE | ORAL | 0 refills | Status: AC

## 2019-01-14 MED FILL — PHENTERMINE HCL 15 MG CAPS: 15 | 30 days supply | Qty: 30 | Fill #0

## 2019-01-14 NOTE — Progress Notes (Signed)
Virtual Visit via Video Note  I connected with Frank Pitts Agent on 01/14/19 at 10:30 AM EDT by a video enabled telemedicine application and verified that I am speaking with the correct person using two identifiers.  Location patient: home Location provider:work or home office Persons participating in the virtual visit: patient, provider  I discussed the limitations of evaluation and management by telemedicine and the availability of in person appointments. The patient expressed understanding and agreed to proceed.   HPI: Is a 28 year old male who is being evaluated today for morbid obesity.  He was last seen for this in August 2019 at which time he wanted to discuss going back on phentermine.  He has been on phentermine in the past back in 2017 and did very well with this.  He was able to drop from 256 pounds to 181 pounds.  At this time when he stopped phentermine he got out of his diet and exercise habits and was then back up to 248 pounds.  In August we decided to do another short course of phentermine, he did not follow-up after that appointment.  Today he reports that he continues to work on diet and exercise, his current weight is 209 pounds but he feels "stagnant".  He has been eating healthy and is walking since the gym is closed. His biggest issue right now is portion control. He is thinking about going back on the vegan diet that he did in the past and had great results.   He would like to do a short course of Phentermine to help " jump start" his weight loss.   Wt Readings from Last 3 Encounters:  01/14/19 209 lb (94.8 kg)  05/15/18 248 lb (112.5 kg)  09/05/17 234 lb (106.1 kg)    ROS: See pertinent positives and negatives per HPI.  Past Medical History:  Diagnosis Date  . Anxiety   . Asthma   . Kidney stones   . Scoliosis     Past Surgical History:  Procedure Laterality Date  . fractured femur    . KNEE ARTHROSCOPY  2014 and 2015  . rod removal from prior surgery     left femur     Family History  Problem Relation Age of Onset  . Hypertension Unknown        paternal side   . Diabetes Unknown        paternal side   . Cancer Maternal Grandfather        Colon Cancer     Current Outpatient Medications:  .  Albuterol Sulfate (PROAIR RESPICLICK) 108 (90 Base) MCG/ACT AEPB, Inhale 108 mcg into the lungs 2 (two) times daily as needed., Disp: 1 each, Rfl: 3 .  butalbital-aspirin-caffeine (FIORINAL) 50-325-40 MG capsule, Take 1 capsule by mouth every 6 (six) hours as needed for headache., Disp: 14 capsule, Rfl: 5 .  citalopram (CELEXA) 10 MG tablet, Take 1 tablet (10 mg total) by mouth daily., Disp: 30 tablet, Rfl: 3 .  ibuprofen (ADVIL,MOTRIN) 800 MG tablet, Take 800 mg by mouth every 8 (eight) hours as needed for moderate pain., Disp: , Rfl:  .  Multiple Vitamin (MULTIVITAMIN) tablet, Take 1 tablet by mouth daily., Disp: , Rfl:  .  phentermine 15 MG capsule, Take 1 capsule (15 mg total) by mouth every morning., Disp: 30 capsule, Rfl: 0  EXAM:  VITALS per patient if applicable:  GENERAL: alert, oriented, appears well and in no acute distress  HEENT: atraumatic, conjunttiva clear, no obvious abnormalities on inspection of  external nose and ears  NECK: normal movements of the head and neck  LUNGS: on inspection no signs of respiratory distress, breathing rate appears normal, no obvious gross SOB, gasping or wheezing  CV: no obvious cyanosis  MS: moves all visible extremities without noticeable abnormality  PSYCH/NEURO: pleasant and cooperative, no obvious depression or anxiety, speech and thought processing grossly intact  ASSESSMENT AND PLAN:  Discussed the following assessment and plan:  Other obesity - Plan: phentermine 15 MG capsule   I am ok with a short course of Phentermine. He has been able to lose weight since the last time I saw him. Will send in 30 day supply and then reevaluate in one month. Advised to follow up sooner if he  develops side effects such as tachycardia or insomnia   Advised to work on portion control, add more fiber to diet and continue to walk but increase pace of walking.   I discussed the assessment and treatment plan with the patient. The patient was provided an opportunity to ask questions and all were answered. The patient agreed with the plan and demonstrated an understanding of the instructions.   The patient was advised to call back or seek an in-person evaluation if the symptoms worsen or if the condition fails to improve as anticipated.   [name], NP

## 2019-06-08 ENCOUNTER — Encounter: Payer: Self-pay | Admitting: Adult Health

## 2019-06-08 ENCOUNTER — Ambulatory Visit (INDEPENDENT_AMBULATORY_CARE_PROVIDER_SITE_OTHER): Payer: Medicaid Other | Admitting: Adult Health

## 2019-06-08 VITALS — BP 128/76 | Temp 98.1°F | Ht 72.0 in | Wt 189.0 lb

## 2019-06-08 DIAGNOSIS — Z0000 Encounter for general adult medical examination without abnormal findings: Secondary | ICD-10-CM

## 2019-06-08 LAB — CBC WITH DIFFERENTIAL/PLATELET
Basophils Absolute: 0.1 10*3/uL (ref 0.0–0.1)
Basophils Relative: 1 % (ref 0.0–3.0)
Eosinophils Absolute: 0.1 10*3/uL (ref 0.0–0.7)
Eosinophils Relative: 1.6 % (ref 0.0–5.0)
HCT: 48.2 % (ref 39.0–52.0)
Hemoglobin: 16.3 g/dL (ref 13.0–17.0)
Lymphocytes Relative: 26.2 % (ref 12.0–46.0)
Lymphs Abs: 1.6 10*3/uL (ref 0.7–4.0)
MCHC: 33.7 g/dL (ref 30.0–36.0)
MCV: 88 fl (ref 78.0–100.0)
Monocytes Absolute: 0.4 10*3/uL (ref 0.1–1.0)
Monocytes Relative: 7.4 % (ref 3.0–12.0)
Neutro Abs: 3.8 10*3/uL (ref 1.4–7.7)
Neutrophils Relative %: 63.8 % (ref 43.0–77.0)
Platelets: 314 10*3/uL (ref 150.0–400.0)
RBC: 5.48 Mil/uL (ref 4.22–5.81)
RDW: 13 % (ref 11.5–15.5)
WBC: 5.9 10*3/uL (ref 4.0–10.5)

## 2019-06-08 LAB — COMPREHENSIVE METABOLIC PANEL
ALT: 11 U/L (ref 0–53)
AST: 13 U/L (ref 0–37)
Albumin: 4.7 g/dL (ref 3.5–5.2)
Alkaline Phosphatase: 66 U/L (ref 39–117)
BUN: 9 mg/dL (ref 6–23)
CO2: 31 mEq/L (ref 19–32)
Calcium: 10.1 mg/dL (ref 8.4–10.5)
Chloride: 100 mEq/L (ref 96–112)
Creatinine, Ser: 0.92 mg/dL (ref 0.40–1.50)
GFR: 98.6 mL/min (ref >60.00)
Glucose, Bld: 85 mg/dL (ref 70–99)
Potassium: 4.5 mEq/L (ref 3.5–5.1)
Sodium: 138 mEq/L (ref 135–145)
Total Bilirubin: 0.7 mg/dL (ref 0.2–1.2)
Total Protein: 7.7 g/dL (ref 6.0–8.3)

## 2019-06-08 LAB — TSH: TSH: 1.37 u[IU]/mL (ref 0.35–4.50)

## 2019-06-08 NOTE — Progress Notes (Signed)
Subjective:    Patient ID: Frank Pitts, male    DOB: 1991/02/19, 28 y.o.   MRN: [id number]  HPI Patient presents for yearly preventative medicine examination. He is a pleasant 28 year old male who  has a past medical history of Anxiety, Asthma, Kidney stones, and Scoliosis.  He is not currently taking any medication. 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 He needs some paperwork filled out   All immunizations and health maintenance protocols were reviewed with the patient and needed orders were placed.  Appropriate screening laboratory values were ordered for the patient including screening of hyperlipidemia, renal function and hepatic function.  Medication reconciliation,  past medical history, social history, problem list and allergies were reviewed in detail with the patient  Goals were established with regard to weight loss, exercise, and  diet in compliance with medications. He has been eating healthy but has not been able to exercise as much as he would like with his gym being closed  Wt Readings from Last 3 Encounters:  06/08/19 189 lb (85.7 kg)  01/14/19 209 lb (94.8 kg)  05/15/18 248 lb (112.5 kg)   End of life planning was discussed.  Review of Systems  Constitutional: Negative.   HENT: Negative.   Eyes: Negative.   Respiratory: Negative.   Cardiovascular: Negative.   Gastrointestinal: Negative.   Endocrine: Negative.   Genitourinary: Negative.   Musculoskeletal: Negative.   Skin: Negative.   Allergic/Immunologic: Negative.   Neurological: Negative.   Hematological: Negative.   Psychiatric/Behavioral: Negative.   All other systems reviewed and are negative.  Past Medical History:  Diagnosis Date  . Anxiety   . Asthma   . Kidney stones   . Scoliosis     Social History   Socioeconomic History  . Marital status: Married    Spouse name: Not on file  . Number of children: Not on file  . Years of  education: Not on file  . Highest education level: Not on file  Occupational History  . Not on file  Social Needs  . Financial resource strain: Not on file  . Food insecurity    Worry: Not on file    Inability: Not on file  . Transportation needs    Medical: Not on file    Non-medical: Not on file  Tobacco Use  . Smoking status: Never Smoker  . Smokeless tobacco: Never Used  Substance and Sexual Activity  . Alcohol use: Yes    Alcohol/week: 0.0 standard drinks    Comment: per pt rare;   Marland Kitchen Drug use: No  . Sexual activity: Yes  Lifestyle  . Physical activity    Days per week: Not on file    Minutes per session: Not on file  . Stress: Not on file  Relationships  . Social Musician on phone: Not on file    Gets together: Not on file    Attends religious service: Not on file    Active member of club or organization: Not on file    Attends meetings of clubs or organizations: Not on file    Relationship status: Not on file  . Intimate partner violence    Fear of current or ex partner: Not on file    Emotionally abused: Not on file    Physically abused: Not on file    Forced sexual activity: Not on file  Other Topics Concern  .  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 fractured femur    . KNEE ARTHROSCOPY  2014 and 2015  . rod removal from prior surgery     left femur     Family History  Problem Relation Age of Onset  . Hypertension Unknown        paternal side   . Diabetes Unknown        paternal side   . Cancer Maternal Grandfather        Colon Cancer    No Known Allergies  Current Outpatient Medications on File Prior to Visit  Medication Sig Dispense Refill  . phentermine 15 MG capsule Take 1 capsule (15 mg total) by mouth every morning. (Patient not taking: Reported on 06/08/2019) 30 capsule 0   No current  facility-administered medications on file prior to visit.     BP (!) 130/96   Temp 98.1 F (36.7 C)   Ht 6' (1.829 m)   Wt 189 lb (85.7 kg)   BMI 25.63 kg/m       Objective:   Physical Exam Vitals signs and nursing note reviewed.  Constitutional:      General: He is not in acute distress.    Appearance: Normal appearance. He is well-developed. He is not diaphoretic.  HENT:     Head: Normocephalic and atraumatic.     Right Ear: Tympanic membrane, ear canal and external ear normal. There is no impacted cerumen.     Left Ear: Tympanic membrane, ear canal and external ear normal.     Nose: Nose normal. No congestion or rhinorrhea.     Mouth/Throat:     Mouth: Mucous membranes are moist.     Pharynx: Oropharynx is clear. No oropharyngeal exudate.  Eyes:     General: No scleral icterus.       Right eye: No discharge.        Left eye: No discharge.     Extraocular Movements: Extraocular movements intact.     Conjunctiva/sclera: Conjunctivae normal.     Pupils: Pupils are equal, round, and reactive to light.  Neck:     Thyroid: No thyromegaly.     Trachea: No tracheal deviation.  Cardiovascular:     Rate and Rhythm: Normal rate and regular rhythm.     Pulses: Normal pulses.     Heart sounds: Normal heart sounds. No murmur. No friction rub. No gallop.   Pulmonary:     Effort: Pulmonary effort is normal. No respiratory distress.     Breath sounds: Normal breath sounds. No stridor. No wheezing, rhonchi or rales.  Chest:     Chest wall: No tenderness.  Abdominal:     General: Bowel sounds are normal. There is no distension.     Palpations: Abdomen is soft. There is no mass.     Tenderness: There is no abdominal tenderness. There is no right CVA tenderness, left CVA tenderness, guarding or rebound.     Hernia: No hernia is present.  Musculoskeletal: Normal range of motion.        General: No swelling, tenderness, deformity or signs of injury.     Right lower leg: No edema.      Left lower leg: No edema.  Lymphadenopathy:     Cervical: No cervical adenopathy.  Skin:    General: Skin is warm and dry.  Capillary Refill: Capillary refill takes less than 2 seconds.     Coloration: Skin is not jaundiced or pale.     Findings: No bruising, erythema, lesion or rash.  Neurological:     General: No focal deficit present.     Mental Status: He is alert and oriented to person, place, and time.     Cranial Nerves: No cranial nerve deficit.     Sensory: No sensory deficit.     Motor: No weakness.     Coordination: Coordination normal.     Gait: Gait normal.     Deep Tendon Reflexes: Reflexes normal.  Psychiatric:        Mood and Affect: Mood normal.        Behavior: Behavior normal.        Thought Content: Thought content normal.        Judgment: Judgment normal.        Assessment & Plan:  1. Routine general medical examination at a health care facility - Wished him the very best in [place].  - Will fill out his work forms  - CBC with Differential/Platelet - Comprehensive metabolic panel - TSH  Dorothyann Peng, NP
